# Patient Record
Sex: Male | Born: 1990
Health system: Southern US, Community
[De-identification: ages and names within clinical notes are randomized; demographics above are authoritative.]

## PROBLEM LIST (undated history)

## (undated) DIAGNOSIS — E7800 Pure hypercholesterolemia, unspecified: Secondary | ICD-10-CM

## (undated) DIAGNOSIS — E119 Type 2 diabetes mellitus without complications: Secondary | ICD-10-CM

## (undated) HISTORY — PX: VASECTOMY: SHX75

## (undated) HISTORY — PX: KNEE SURGERY: SHX244

---

## 2013-07-09 ENCOUNTER — Encounter: Payer: Self-pay | Admitting: Family Medicine

## 2013-07-09 ENCOUNTER — Ambulatory Visit (INDEPENDENT_AMBULATORY_CARE_PROVIDER_SITE_OTHER): Payer: BC Managed Care – PPO | Admitting: Family Medicine

## 2013-07-09 ENCOUNTER — Ambulatory Visit (INDEPENDENT_AMBULATORY_CARE_PROVIDER_SITE_OTHER): Payer: BC Managed Care – PPO

## 2013-07-09 VITALS — BP 143/88 | HR 75 | Temp 98.7°F | Ht 73.0 in | Wt 339.0 lb

## 2013-07-09 DIAGNOSIS — M25571 Pain in right ankle and joints of right foot: Secondary | ICD-10-CM

## 2013-07-09 DIAGNOSIS — M25579 Pain in unspecified ankle and joints of unspecified foot: Secondary | ICD-10-CM

## 2013-07-09 MED ORDER — MELOXICAM 15 MG PO TABS: 15.0000 mg | ORAL_TABLET | Freq: Every day | ORAL | Status: DC

## 2013-07-09 NOTE — Progress Notes (Signed)
  Subjective:    Patient ID: Adam Gomez, male    DOB: [birth year], 22 y.o.   MRN: [id number]  HPI Comments: Prior hx/o ankle sprains in the past assd with playing basketball Morbidly obese.    Ankle Pain  The incident occurred more than 1 week ago. The incident occurred at work. Injury mechanism: pt rolled ankle at work. The pain is present in the right ankle. The quality of the pain is described as aching. The pain is at a severity of 5/10. The pain is mild. The pain has been constant since onset. Associated symptoms include a loss of motion. Pertinent negatives include no loss of sensation, muscle weakness, numbness or tingling. He reports no foreign bodies present. The symptoms are aggravated by movement. He has tried nothing for the symptoms.      Review of Systems  Neurological: Negative for tingling and numbness.  All other systems reviewed and are negative.       Objective:   Physical Exam  Constitutional:  Morbidly obese   HENT:  Head: Normocephalic and atraumatic.  Eyes: Pupils are equal, round, and reactive to light.  Neck: Normal range of motion.  Cardiovascular: Normal rate and regular rhythm.   Pulmonary/Chest: Effort normal and breath sounds normal.  Abdominal: Soft.  Musculoskeletal:       Feet:  + TTP over affected  Mild swelling Full ROM    Neurological: He is alert.  Skin: Skin is warm.   [facility] reading (PRIMARY) by  Dr. Alvester Morin Preliminary R ankle xray with no fracture or dislocation.                                          Assessment & Plan:  Right ankle pain - Plan: DG Ankle Complete Right, meloxicam (MOBIC) 15 MG tablet  Xray preliminarily without fracture or dislocation.  Will brace  RICE and NSAIDs.  Discussed weight loss.  Discussed MSK red flags.  Follow up as needed.  Consider sports med referral if sxs persist.

## 2013-10-22 ENCOUNTER — Ambulatory Visit (INDEPENDENT_AMBULATORY_CARE_PROVIDER_SITE_OTHER): Payer: BC Managed Care – PPO | Admitting: Family Medicine

## 2013-10-22 DIAGNOSIS — H6690 Otitis media, unspecified, unspecified ear: Secondary | ICD-10-CM

## 2013-10-22 MED ORDER — AMOXICILLIN 875 MG PO TABS: 875.0000 mg | ORAL_TABLET | Freq: Two times a day (BID) | ORAL | Status: DC

## 2013-10-22 NOTE — Progress Notes (Signed)
   Subjective:    Patient ID: Dwana MelenaGarrett Calame, male    DOB: May 18, 1991, 23 y.o.   MRN: [id number]  HPI This 23 y.o. male presents for evaluation of bilateral ear discomfort an decreased hearing acuity.   Review of Systems No chest pain, SOB, HA, dizziness, vision change, N/V, diarrhea, constipation, dysuria, urinary urgency or frequency, myalgias, arthralgias or rash.     Objective:   Physical Exam Vital signs noted  Well developed well nourished male.  HEENT - Head atraumatic Normocephalic                Eyes - PERRLA, Conjuctiva - clear Sclera- Clear EOMI                Ears - EAC's Wnl TM's dull and injected bilateral                Nose - Nares boggy and decreased bilateral                Throat - oropharanx wnl Respiratory - Lungs CTA bilateral Cardiac - RRR S1 and S2 without murmur GI - Abdomen soft Nontender and bowel sounds active x 4 Extremities - No edema. Neuro - Grossly intact.       Assessment & Plan:  Otitis media, bilateral - Plan: amoxicillin (AMOXIL) 875 MG tablet Po bid x 10 days #20 Push po fluids, rest, tylenol and motrin otc prn as directed for fever, arthralgias, and myalgias.  Follow up prn if sx's continue or persist.  Deatra CanterWilliam J Tarhonda Hollenberg FNP

## 2013-10-22 NOTE — Patient Instructions (Signed)
Otitis Media with Effusion °Otitis media with effusion is the presence of fluid in the middle ear. This is a common problem that often follows ear infections. It may be present for weeks or longer after the infection. Unlike an acute ear infection, otits media with effusion refers only to fluid behind the ear drum and not infection. Children with repeated ear and sinus infections and allergy problems are the most likely to get otitis media with effusion. °CAUSES  °The most frequent cause of the fluid buildup is dysfunction of the eustacian tubes. These are the tubes that drain fluid in the ears to the throat. °SYMPTOMS  °· The main symptom of this condition is hearing loss. As a result, you or your child may: °· Listen to the TV at a loud volume. °· Not respond to questions. °· Ask "what" often when spoken to. °· There may be a sensation of fullness or pressure but usually not pain. °DIAGNOSIS  °· Your caregiver will diagnose this condition by examining you or your child's ears. °· Your caregiver may test the pressure in you or your child's ear with a tympanometer. °· A hearing test may be conducted if the problem persists. °· A caregiver will want to re-evaluate the condition periodically to see if it improves. °TREATMENT  °· Treatment depends on the duration and the effects of the effusion. °· Antibiotics, decongestants, nose drops, and cortisone-type drugs may not be helpful. °· Children with persistent ear effusions may have delayed language. Children at risk for developmental delays in hearing, learning, and speech may require referral to a specialist earlier than children not at risk. °· You or your child's caregiver may suggest a referral to an Ear, Nose, and Throat (ENT) surgeon for treatment. The following may help restore normal hearing: °· Drainage of fluid. °· Placement of ear tubes (tympanostomy tubes). °· Removal of adenoids (adenoidectomy). °HOME CARE INSTRUCTIONS  °· Avoid second hand  smoke. °· Infants who are breast fed are less likely to have this condition. °· Avoid feeding infants while laying flat. °· Avoid known environmental allergens. °· Be sure to see a caregiver or an ENT specialist for follow up. °· Avoid people who are sick. °SEEK MEDICAL CARE IF:  °· Hearing is not better in 3 months. °· Hearing is worse. °· Ear pain. °· Drainage from the ear. °· Dizziness. °Document Released: 11/14/2004 Document Revised: 12/30/2011 Document Reviewed: 05/04/2013 °[employer]® Patient Information ©2014 [employer]. ° °

## 2014-08-02 ENCOUNTER — Telehealth: Payer: Self-pay | Admitting: Family Medicine

## 2014-08-05 ENCOUNTER — Ambulatory Visit (INDEPENDENT_AMBULATORY_CARE_PROVIDER_SITE_OTHER): Payer: BC Managed Care – PPO | Admitting: Family Medicine

## 2014-08-05 ENCOUNTER — Encounter: Payer: Self-pay | Admitting: Family Medicine

## 2014-08-05 VITALS — BP 129/83 | HR 87 | Temp 98.6°F | Ht 73.0 in | Wt 327.2 lb

## 2014-08-05 DIAGNOSIS — J0100 Acute maxillary sinusitis, unspecified: Secondary | ICD-10-CM

## 2014-08-05 MED ORDER — METHYLPREDNISOLONE ACETATE 80 MG/ML IJ SUSP
80.0000 mg | Freq: Once | INTRAMUSCULAR | Status: AC
  Administered 2014-08-05: 80 mg via INTRAMUSCULAR

## 2014-08-05 MED ORDER — AMOXICILLIN 875 MG PO TABS: 875.0000 mg | ORAL_TABLET | Freq: Two times a day (BID) | ORAL | Status: DC

## 2014-08-05 NOTE — Progress Notes (Signed)
   Subjective:    Patient ID: Adam Gomez, male    DOB: January 18, 1991, 23 y.o.   MRN: [id number]  HPI This 23 y.o. male presents for evaluation of c/o uri sx's.   Review of Systems No chest pain, SOB, HA, dizziness, vision change, N/V, diarrhea, constipation, dysuria, urinary urgency or frequency, myalgias, arthralgias or rash.     Objective:   Physical Exam  Vital signs noted  Well developed well nourished male.  HEENT - Head atraumatic Normocephalic                Eyes - PERRLA, Conjuctiva - clear Sclera- Clear EOMI                Ears - EAC's Wnl TM's Wnl Gross Hearing WNL                Nose - Nares patent                 Throat - oropharanx wnl Respiratory - Lungs CTA bilateral Cardiac - RRR S1 and S2 without murmur GI - Abdomen soft Nontender and bowel sounds active x 4 Extremities - No edema. Neuro - Grossly intact.      Assessment & Plan:  Subacute maxillary sinusitis - Plan: amoxicillin (AMOXIL) 875 MG tablet, methylPREDNISolone acetate (DEPO-MEDROL) injection 80 mg

## 2014-08-09 NOTE — Telephone Encounter (Signed)
Patient was seen on 10/16 with Adam Gomez

## 2014-08-13 IMAGING — CR DG ANKLE COMPLETE 3+V*R*
3 series · 3 of 3 positions shown · non-contrast
Comparison: None.

CLINICAL DATA: Right ankle pain, no known injury

EXAM:
RIGHT ANKLE - COMPLETE 3+ VIEW

[view not recorded (1 of 3)]
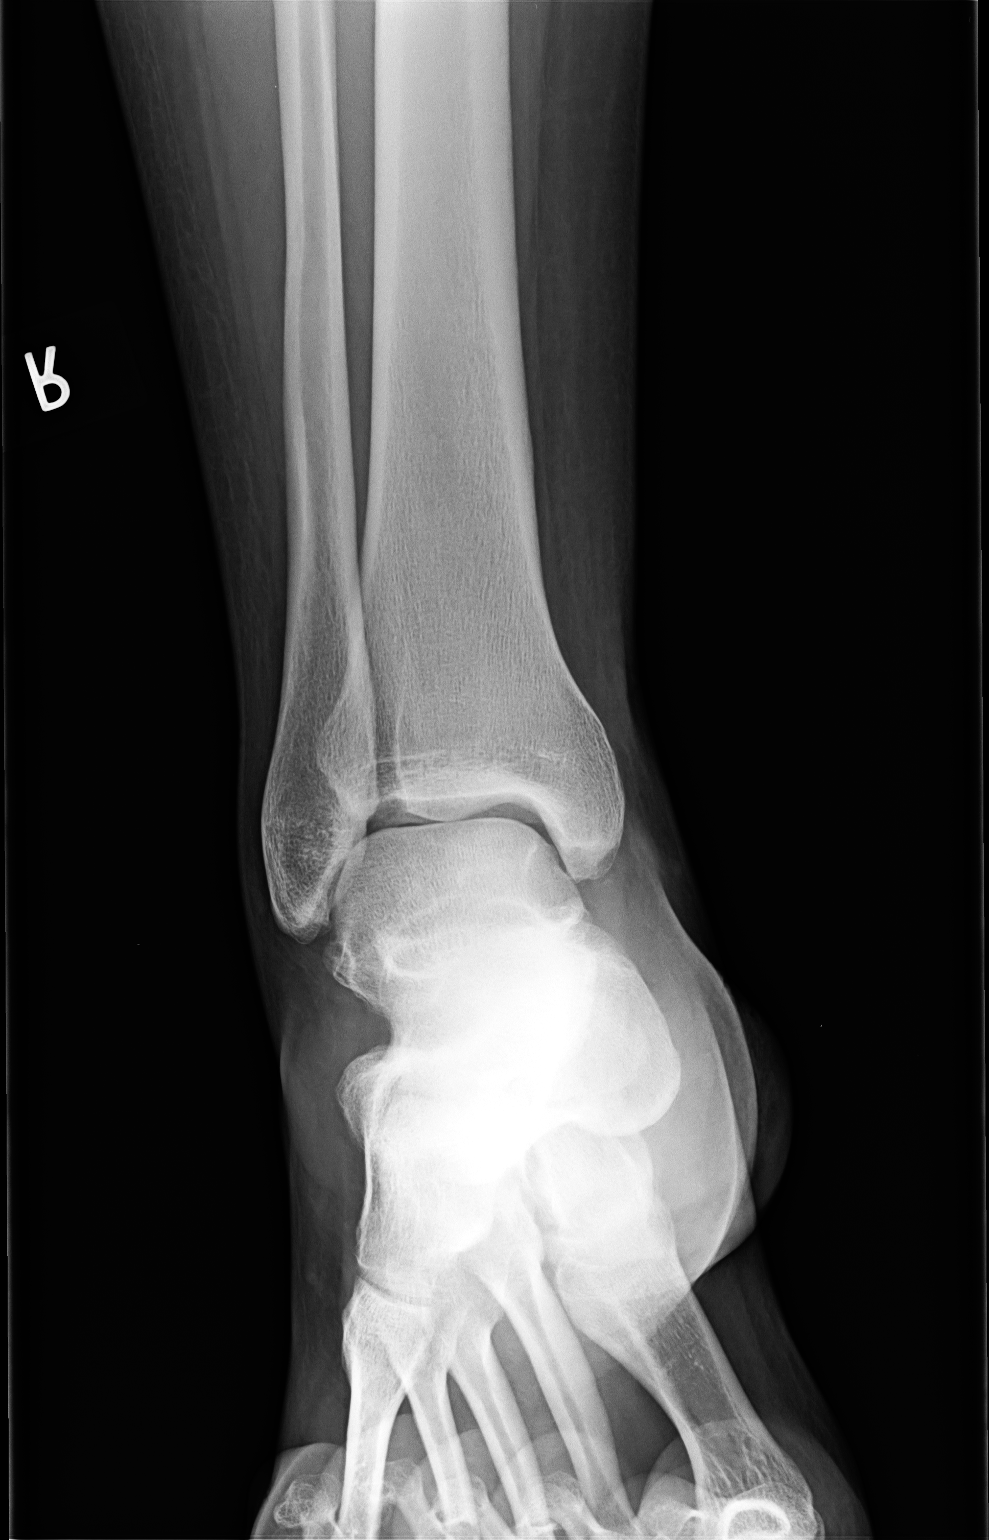

[view not recorded (2 of 3)]
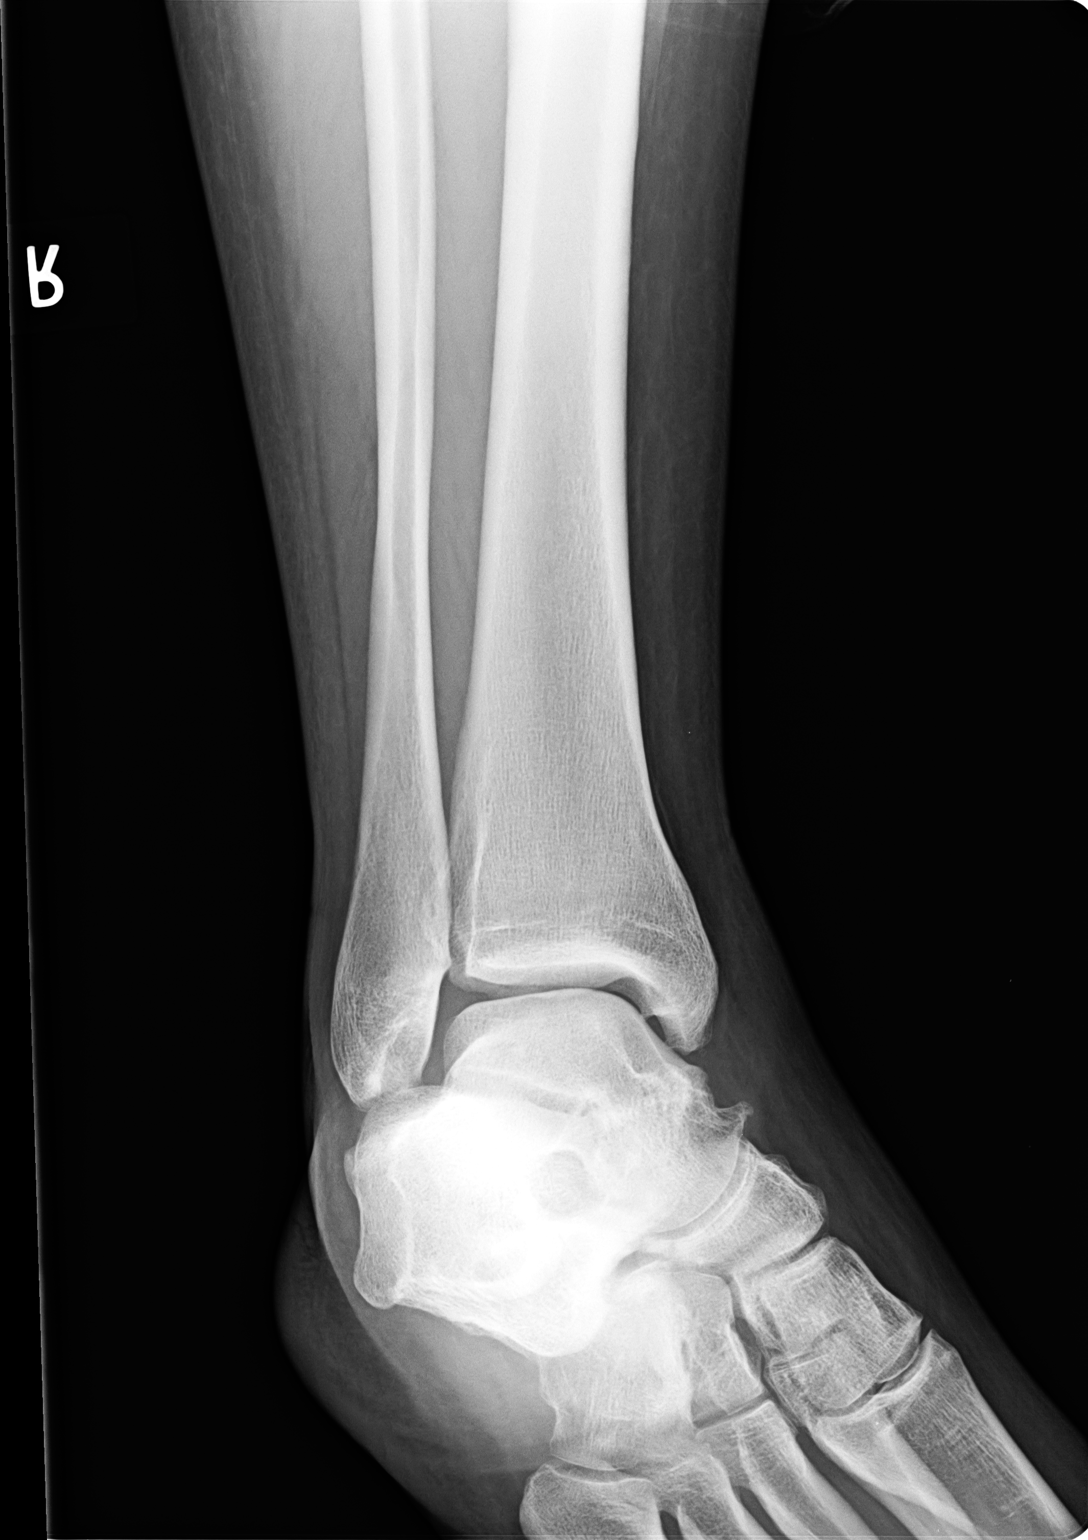

[view not recorded (3 of 3)]
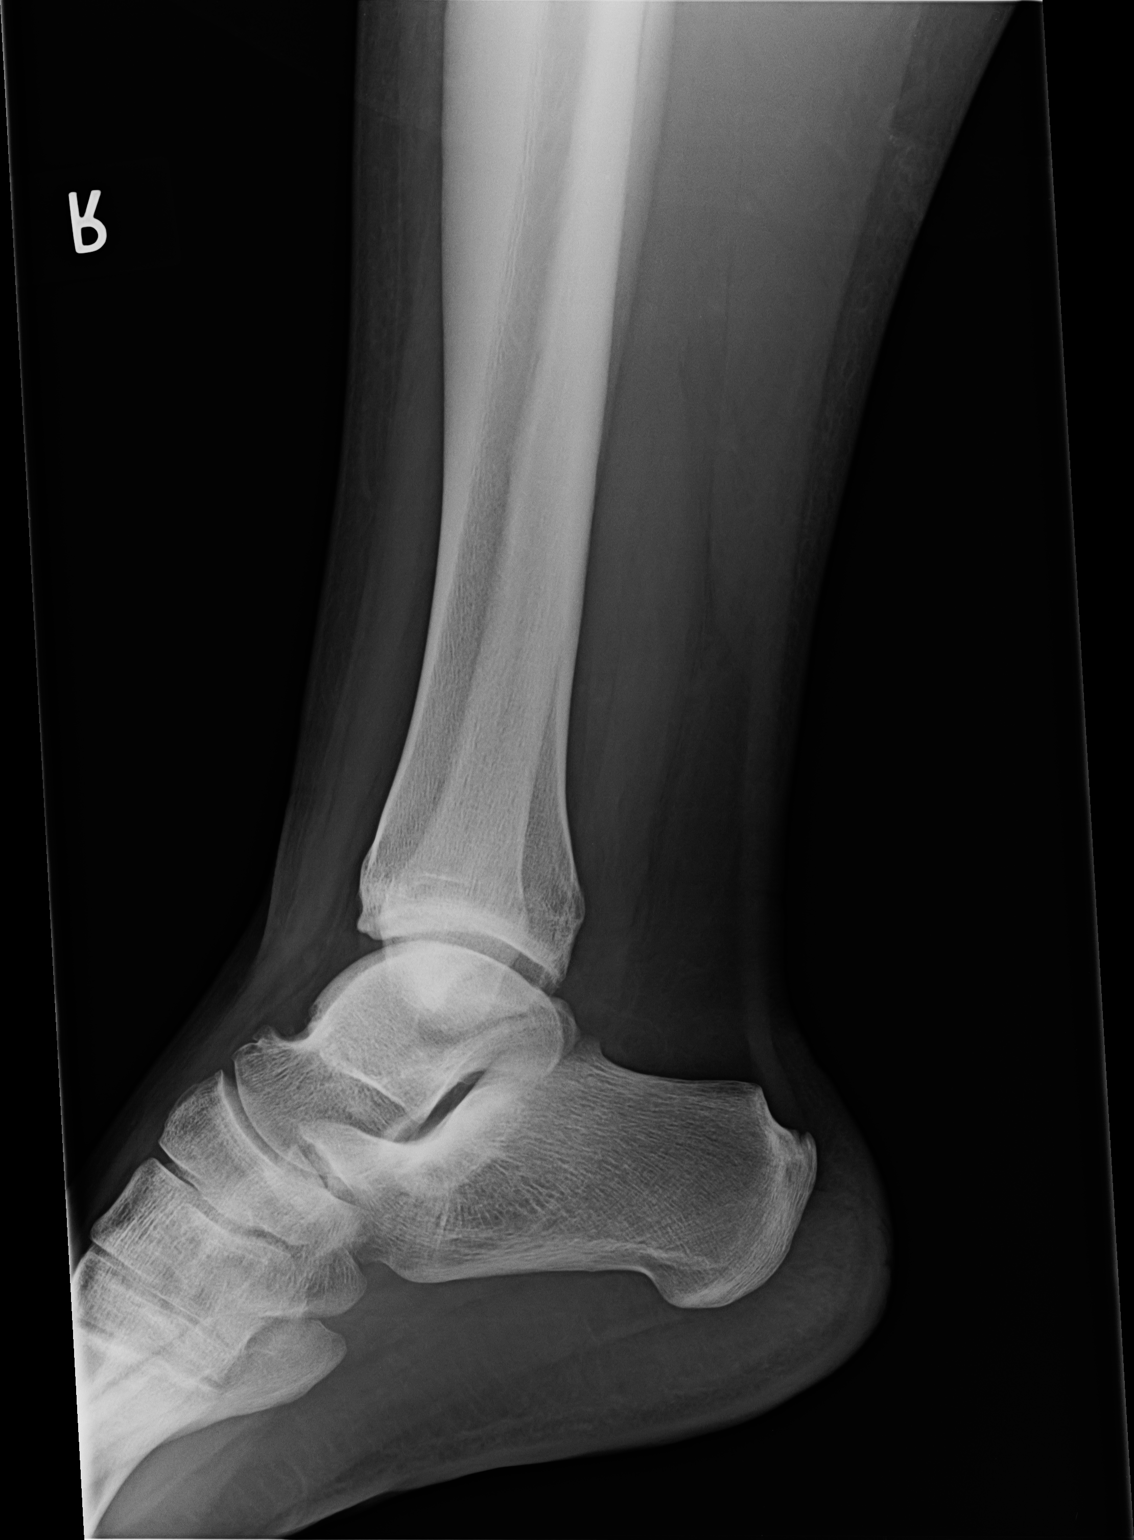

[3 of 3 positions shown; findings below may reference images not displayed]

FINDINGS: There is no evidence of fracture, dislocation, or joint effusion.
Mild spurring off the anterior superior aspect of the talus. Mild
spurring posterior aspect of calcaneus at Achilles tendon insertion.
Ankle mortise is preserved.
IMPRESSION: No acute fracture or subluxation. Mild spurring of talus and
posterior aspect of calcaneus.

## 2015-11-24 ENCOUNTER — Encounter: Payer: Self-pay | Admitting: Nurse Practitioner

## 2015-11-24 ENCOUNTER — Ambulatory Visit: Payer: Self-pay | Admitting: Nurse Practitioner

## 2015-11-24 ENCOUNTER — Ambulatory Visit (INDEPENDENT_AMBULATORY_CARE_PROVIDER_SITE_OTHER): Payer: BLUE CROSS/BLUE SHIELD | Admitting: Nurse Practitioner

## 2015-11-24 VITALS — BP 119/80 | HR 69 | Temp 97.1°F | Ht 73.0 in | Wt 353.0 lb

## 2015-11-24 DIAGNOSIS — J029 Acute pharyngitis, unspecified: Secondary | ICD-10-CM

## 2015-11-24 LAB — POCT RAPID STREP A (OFFICE): Rapid Strep A Screen: NEGATIVE

## 2015-11-24 NOTE — Progress Notes (Signed)
   Subjective:    Patient ID: Adam Gomez, male    DOB: October 13, 1991, 25 y.o.   MRN: [id number]  Sore Throat       Review of Systems     Objective:   Physical Exam        Assessment & Plan:   Subjective:     Adam Gomez is a 25 y.o. male who presents for evaluation of sore throat. Associated symptoms include nasal blockage, post nasal drip, sinus and nasal congestion and sore throat and headache. Onset of symptoms was 4 days ago, and have been gradually worsening since that time. He is drinking plenty of fluids. He has not had a recent close exposure to someone with proven streptococcal pharyngitis.  The following portions of the patient's history were reviewed and updated as appropriate: allergies, current medications, past family history, past medical history, past social history, past surgical history and problem list.  Review of Systems Pertinent items are noted in HPI.    Objective:    BP 119/80 mmHg  Pulse 69  Temp(Src) 97.1 F (36.2 C) (Oral)  Ht  (1.854 m)  Wt 353 lb (160.12 kg)  BMI 46.58 kg/m2 General appearance: alert and cooperative Ears: normal TM's and external ear canals both ears Nose: purulent discharge, moderate congestion, turbinates red Throat: lips, mucosa, and tongue normal; teeth and gums normal Neck: no adenopathy, no carotid bruit, no JVD, supple, symmetrical, trachea midline and thyroid not enlarged, symmetric, no tenderness/mass/nodules Back: symmetric, no curvature. ROM normal. No CVA tenderness. Heart: regular rate and rhythm, S1, S2 normal, no murmur, click, rub or gallop Lungs: CLear in all fields Laboratory Strep test done. Results: Results for orders placed or performed in visit on 11/24/15  POCT rapid strep A  Result Value Ref Range   Rapid Strep A Screen Negative Negative   .    Assessment:    Acute pharyngitis, likely  Viral pharyngitis.    Plan:   Force fluids Motrin or tylenol OTC OTC decongestant Throat  lozenges if help New toothbrush in 3 days  Mary-Margaret Daphine Deutscher, FNP

## 2016-03-13 ENCOUNTER — Ambulatory Visit (INDEPENDENT_AMBULATORY_CARE_PROVIDER_SITE_OTHER): Payer: BLUE CROSS/BLUE SHIELD | Admitting: Family Medicine

## 2016-03-13 ENCOUNTER — Encounter: Payer: Self-pay | Admitting: Family Medicine

## 2016-03-13 VITALS — BP 137/87 | HR 69 | Temp 98.4°F | Ht 73.0 in | Wt 350.6 lb

## 2016-03-13 DIAGNOSIS — W57XXXA Bitten or stung by nonvenomous insect and other nonvenomous arthropods, initial encounter: Secondary | ICD-10-CM | POA: Diagnosis not present

## 2016-03-13 DIAGNOSIS — T148 Other injury of unspecified body region: Secondary | ICD-10-CM

## 2016-03-13 MED ORDER — DOXYCYCLINE HYCLATE 100 MG PO TABS: 100.0000 mg | ORAL_TABLET | Freq: Two times a day (BID) | ORAL | Status: DC

## 2016-03-13 NOTE — Progress Notes (Signed)
BP 137/87 mmHg  Pulse 69  Temp(Src) 98.4 F (36.9 C) (Oral)  Ht  (1.854 m)  Wt 350 lb 9.6 oz (159.031 kg)  BMI 46.27 kg/m2   Subjective:    Patient ID: Adam Gomez, male    DOB: 02/21/1991, 25 y.o.   MRN: [id number]  HPI: Adam Gomez is a 25 y.o. male presenting on 03/13/2016 for Insect Bite   HPI Take by on fourth toe of left foot Patient found a very small tick on his fourth toe of his left foot and it was removed last night but today developed swelling that goes from the second two fourth toe and some erythema and significant pain. He has had no drainage or purulence. He is able to still move the toe completely and still has sensation in that toe as well. The worst of the swelling is overlying the second toe. He denies any fevers or chills or myalgias or rashes anywhere else.  Relevant past medical, surgical, family and social history reviewed and updated as indicated. Interim medical history since our last visit reviewed. Allergies and medications reviewed and updated.  Review of Systems  Constitutional: Negative for fever and chills.  HENT: Negative for ear discharge and ear pain.   Eyes: Negative for discharge and visual disturbance.  Respiratory: Negative for shortness of breath and wheezing.   Cardiovascular: Negative for chest pain and leg swelling.  Gastrointestinal: Negative for abdominal pain, diarrhea and constipation.  Genitourinary: Negative for difficulty urinating.  Musculoskeletal: Negative for back pain and gait problem.  Skin: Positive for color change and wound. Negative for rash.  Neurological: Negative for syncope, light-headedness and headaches.  All other systems reviewed and are negative.   Per HPI unless specifically indicated above     Medication List       This list is accurate as of: 03/13/16  9:40 AM.  Always use your most recent med list.               doxycycline 100 MG tablet  Commonly known as:  VIBRA-TABS  Take 1  tablet (100 mg total) by mouth 2 (two) times daily. 1 po bid     meloxicam 15 MG tablet  Commonly known as:  MOBIC  Take 15 mg by mouth as needed for pain.           Objective:    BP 137/87 mmHg  Pulse 69  Temp(Src) 98.4 F (36.9 C) (Oral)  Ht  (1.854 m)  Wt 350 lb 9.6 oz (159.031 kg)  BMI 46.27 kg/m2  Wt Readings from Last 3 Encounters:  03/13/16 350 lb 9.6 oz (159.031 kg)  11/24/15 353 lb (160.12 kg)  08/05/14 327 lb 3.2 oz (148.417 kg)    Physical Exam  Constitutional: He is oriented to person, place, and time. He appears well-developed and well-nourished. No distress.  Eyes: Conjunctivae and EOM are normal. Pupils are equal, round, and reactive to light. Right eye exhibits no discharge. No scleral icterus.  Cardiovascular: Normal rate, regular rhythm, normal heart sounds and intact distal pulses.   No murmur heard. Pulmonary/Chest: Effort normal and breath sounds normal. No respiratory distress. He has no wheezes.  Musculoskeletal: Normal range of motion. He exhibits no edema.  Neurological: He is alert and oriented to person, place, and time. Coordination normal.  Skin: Skin is warm and dry. No rash noted. He is not diaphoretic. There is erythema (Patient has an eschar over fourth toe of left foot and swelling and  erythema extending to the second toe. Sensation and range of motion and capillary refill are all intact).  Psychiatric: He has a normal mood and affect. His behavior is normal.  Vitals reviewed.     Assessment & Plan:   Problem List Items Addressed This Visit    None    Visit Diagnoses    Tick bite    -  Primary    Found take in fourth toe of left foot. Swelling and erythema surrounding that toe and the 2 next to it. Send doxycycline    Relevant Medications    doxycycline (VIBRA-TABS) 100 MG tablet        Follow up plan: Return if symptoms worsen or fail to improve.  Counseling provided for all of the vaccine components No orders of the  defined types were placed in this encounter.    Arville CareJoshua Tiwanna Tuch, MD [facility] 03/13/2016, 9:40 AM

## 2016-03-14 ENCOUNTER — Telehealth: Payer: Self-pay | Admitting: Family Medicine

## 2016-03-14 NOTE — Telephone Encounter (Signed)
That is fine go ahead and do the note.

## 2016-03-14 NOTE — Telephone Encounter (Signed)
Patient aware that letter is ready for pick up at the front desk.

## 2017-03-03 ENCOUNTER — Encounter: Payer: Self-pay | Admitting: *Deleted

## 2017-03-10 ENCOUNTER — Ambulatory Visit (INDEPENDENT_AMBULATORY_CARE_PROVIDER_SITE_OTHER): Payer: BLUE CROSS/BLUE SHIELD | Admitting: Pediatrics

## 2017-03-10 ENCOUNTER — Encounter: Payer: Self-pay | Admitting: Pediatrics

## 2017-03-10 VITALS — BP 134/91 | HR 90 | Temp 97.2°F | Resp 20 | Ht 73.0 in | Wt 357.6 lb

## 2017-03-10 DIAGNOSIS — J069 Acute upper respiratory infection, unspecified: Secondary | ICD-10-CM | POA: Diagnosis not present

## 2017-03-10 DIAGNOSIS — R0683 Snoring: Secondary | ICD-10-CM

## 2017-03-10 DIAGNOSIS — J329 Chronic sinusitis, unspecified: Secondary | ICD-10-CM | POA: Diagnosis not present

## 2017-03-10 MED ORDER — AMOXICILLIN 875 MG PO TABS: 875.0000 mg | ORAL_TABLET | Freq: Two times a day (BID) | ORAL | 0 refills | Status: AC

## 2017-03-10 NOTE — Progress Notes (Signed)
  Subjective:   Patient ID: Adam Gomez, male    DOB: 1991-05-31, 26 y.o.   MRN: [id number] CC: Headache; Nasal Congestion; Ear Pain; and Facial Pain  HPI: Adam Gomez is a 26 y.o. male presenting for Headache; Nasal Congestion; Ear Pain; and Facial Pain  5 days ago started having fatigue, congestion, HA Some drainage down back of throat Coughing a lot Having some cold chills Taking allegra, sudofed, tylenol, excedrin HA improved Congestion slightly improved today Ears draining a lot b/l, some pressure  Appetite down slightly, still eating regularly  Has been feeling tired and fatigued during day for over a year When he sits down sometimes will fall asleep Here today with his wife, she says he snores loudly at night, sometimes will have pauses Works 2nd shift, sometimes only getting 4-6hrs of sleep at night  Relevant past medical, surgical, family and social history reviewed. Allergies and medications reviewed and updated. History  Smoking Status  . Former Smoker  . Quit date: 07/09/2012  Smokeless Tobacco  . Former NeurosurgeonUser   ROS: Per HPI   Objective:    BP (!) 134/91   Pulse 90   Temp 97.2 F (36.2 C) (Oral)   Resp 20   Ht 6\' 1"  (1.854 m)   Wt (!) 357 lb 9.6 oz (162.2 kg)   SpO2 99%   BMI 47.18 kg/m   Wt Readings from Last 3 Encounters:  03/10/17 (!) 357 lb 9.6 oz (162.2 kg)  03/13/16 (!) 350 lb 9.6 oz (159 kg)  11/24/15 (!) 353 lb (160.1 kg)    Gen: NAD, alert, cooperative with exam, NCAT EYES: EOMI, no conjunctival injection, or no icterus ENT:  R TM red, some clear effusion present, L TM red, nl LR,  OP with mild erythema LYMPH: no cervical LAD CV: NRRR, normal S1/S2, no murmur, distal pulses 2+ b/l Resp: CTABL, no wheezes, normal WOB Abd: +BS, soft, NTND.  Ext: No edema, warm Neuro: Alert and oriented MSK: normal muscle bulk  Assessment & Plan:  Adam PebblesGarrett was seen today for headache, nasal congestion, ear pain and facial pain.  Diagnoses and all  orders for this visit:  Acute URI Discussed symptomatic care, return precautions If symptoms worsening over next few days, start below abx.  Sinusitis, unspecified chronicity, unspecified location -     amoxicillin (AMOXIL) 875 MG tablet; Take 1 tablet (875 mg total) by mouth 2 (two) times daily.  Snoring Pauses with breathing at night, loud snoring, not feeling rested Discussed sleep hygiene, will refer for sleep apnea eval Has physical in a couple weeks, will address BMI -     Ambulatory referral to Neurology  Follow up plan: For physical upcoming 2 weeks Rex Krasarol Vincent, MD [facility]

## 2017-03-10 NOTE — Patient Instructions (Addendum)
Netipot with distilled water 2-3 times a day to clear out sinuses Or Normal saline nasal spray Flonase steroid nasal spray Antihistamine daily such as cetirizine Ibuprofen 600mg  three times a day Lots of fluids  Start antibiotic if symptoms worsening over the next 3 days

## 2017-03-13 ENCOUNTER — Telehealth: Payer: Self-pay | Admitting: Pediatrics

## 2017-03-13 NOTE — Telephone Encounter (Signed)
That's fine, thanks!

## 2017-03-13 NOTE — Telephone Encounter (Signed)
Letter wrote and patient aware. 

## 2017-03-18 ENCOUNTER — Encounter: Payer: Self-pay | Admitting: Family Medicine

## 2017-03-18 ENCOUNTER — Ambulatory Visit (INDEPENDENT_AMBULATORY_CARE_PROVIDER_SITE_OTHER): Payer: BLUE CROSS/BLUE SHIELD | Admitting: Family Medicine

## 2017-03-18 VITALS — BP 126/83 | HR 69 | Temp 97.2°F | Ht 73.0 in | Wt 362.0 lb

## 2017-03-18 DIAGNOSIS — R030 Elevated blood-pressure reading, without diagnosis of hypertension: Secondary | ICD-10-CM

## 2017-03-18 DIAGNOSIS — J019 Acute sinusitis, unspecified: Secondary | ICD-10-CM

## 2017-03-18 MED ORDER — FLUTICASONE PROPIONATE 50 MCG/ACT NA SUSP: 1.0000 | Freq: Two times a day (BID) | NASAL | 6 refills | Status: DC | PRN

## 2017-03-18 MED ORDER — FLUTICASONE PROPIONATE 50 MCG/ACT NA SUSP
1.0000 | Freq: Two times a day (BID) | NASAL | 6 refills | Status: AC | PRN
Start: 2017-03-18 — End: ?

## 2017-03-18 NOTE — Assessment & Plan Note (Signed)
Have him recheck in 4 weeks

## 2017-03-18 NOTE — Progress Notes (Signed)
BP 126/83   Pulse 69   Temp 97.2 F (36.2 C) (Oral)   Ht 6\' 1"  (1.854 m)   Wt (!) 362 lb (164.2 kg)   BMI 47.76 kg/m    Subjective:    Patient ID: Adam Gomez, male    DOB: 09/15/1991, 26 y.o.   MRN: [id number]  HPI: Adam Gomez is a 26 y.o. male presenting on 03/18/2017 for Ears are stopped up (has recently had a lot of sinus congestion, pressure in ears; wears earplugs at work)   HPI Sinus congestion and pressure in ears Patient has been having sinus congestion and pressure in ears for the past 2 weeks. He came in about 10 days ago and was seen by her office and given amoxicillin which has improved most of his symptoms except the pressure in his ear. He says he still has a little pressure in his sinuses but mostly is having issues with ears. He feels like he gets a lot of popping especially when he yawns it easy years and is still muffled. He denies any fevers or chills any further. He denies any sick contacts that he knows of. He has started taking Allegra for allergy and has been using nasal saline which is helping some.  Elevated blood pressure today in the office. Recommended four-week follow-up. Patient denies any chest pain or shortness of breath.  Relevant past medical, surgical, family and social history reviewed and updated as indicated. Interim medical history since our last visit reviewed. Allergies and medications reviewed and updated.  Review of Systems  Constitutional: Negative for chills and fever.  HENT: Positive for congestion, ear pain, postnasal drip, rhinorrhea, sinus pressure, sneezing and sore throat. Negative for ear discharge and voice change.   Eyes: Negative for pain, discharge, redness and visual disturbance.  Respiratory: Positive for cough. Negative for shortness of breath and wheezing.   Cardiovascular: Negative for chest pain and leg swelling.  Musculoskeletal: Negative for gait problem.  Skin: Negative for rash.  All other systems reviewed  and are negative.   Per HPI unless specifically indicated above        Objective:    BP (!) 151/96   Pulse 69   Temp 97.2 F (36.2 C) (Oral)   Ht 6\' 1"  (1.854 m)   Wt (!) 362 lb (164.2 kg)   BMI 47.76 kg/m   Wt Readings from Last 3 Encounters:  03/18/17 (!) 362 lb (164.2 kg)  03/10/17 (!) 357 lb 9.6 oz (162.2 kg)  03/13/16 (!) 350 lb 9.6 oz (159 kg)    Physical Exam  Constitutional: He is oriented to person, place, and time. He appears well-developed and well-nourished. No distress.  HENT:  Right Ear: Tympanic membrane, external ear and ear canal normal.  Left Ear: Tympanic membrane, external ear and ear canal normal.  Nose: Mucosal edema and rhinorrhea present. No sinus tenderness. No epistaxis. Right sinus exhibits frontal sinus tenderness. Right sinus exhibits no maxillary sinus tenderness. Left sinus exhibits frontal sinus tenderness. Left sinus exhibits no maxillary sinus tenderness.  Mouth/Throat: Uvula is midline and mucous membranes are normal. Posterior oropharyngeal edema present. No oropharyngeal exudate, posterior oropharyngeal erythema or tonsillar abscesses.  Eyes: Conjunctivae are normal. No scleral icterus.  Neck: Neck supple. No thyromegaly present.  Cardiovascular: Normal rate, regular rhythm, normal heart sounds and intact distal pulses.   No murmur heard. Pulmonary/Chest: Effort normal and breath sounds normal. No respiratory distress. He has no wheezes. He has no rales.  Musculoskeletal: Normal range of  motion. He exhibits no edema.  Lymphadenopathy:    He has no cervical adenopathy.  Neurological: He is alert and oriented to person, place, and time. Coordination normal.  Skin: Skin is warm and dry. No rash noted. He is not diaphoretic.  Psychiatric: He has a normal mood and affect. His behavior is normal.  Nursing note and vitals reviewed.      Assessment & Plan:   Problem List Items Addressed This Visit      Other   Elevated blood pressure  reading    Have him recheck in 4 weeks       Other Visit Diagnoses    Acute rhinosinusitis    -  Primary   Just finished amoxicillin yesterday, recommend Flonase and Mucinex   Relevant Medications   fexofenadine (ALLEGRA) 30 MG tablet   fluticasone (FLONASE) 50 MCG/ACT nasal spray     His second check her blood pressure was good, will just have him come back and do a physical and get labs and make sure it stays good.   Follow up plan: Return if symptoms worsen or fail to improve.  Counseling provided for all of the vaccine components No orders of the defined types were placed in this encounter.   Arville CareJoshua Cecille Mcclusky, MD [facility] 03/18/2017, 10:22 AM

## 2017-04-22 ENCOUNTER — Encounter (INDEPENDENT_AMBULATORY_CARE_PROVIDER_SITE_OTHER): Payer: Self-pay

## 2017-04-22 ENCOUNTER — Encounter: Payer: Self-pay | Admitting: Neurology

## 2017-04-22 ENCOUNTER — Ambulatory Visit (INDEPENDENT_AMBULATORY_CARE_PROVIDER_SITE_OTHER): Payer: BLUE CROSS/BLUE SHIELD | Admitting: Neurology

## 2017-04-22 VITALS — BP 138/89 | HR 78 | Ht 74.0 in | Wt 361.0 lb

## 2017-04-22 DIAGNOSIS — G4726 Circadian rhythm sleep disorder, shift work type: Secondary | ICD-10-CM | POA: Insufficient documentation

## 2017-04-22 DIAGNOSIS — G4710 Hypersomnia, unspecified: Secondary | ICD-10-CM | POA: Insufficient documentation

## 2017-04-22 DIAGNOSIS — E669 Obesity, unspecified: Secondary | ICD-10-CM | POA: Insufficient documentation

## 2017-04-22 DIAGNOSIS — G4719 Other hypersomnia: Secondary | ICD-10-CM | POA: Diagnosis not present

## 2017-04-22 NOTE — Progress Notes (Signed)
SLEEP MEDICINE CLINIC   Provider:  Melvyn Novasarmen  Timothy Trudell, M D  Primary Care Physician:  Johna SheriffVincent, Carol L, MD   Referring Provider: Johna SheriffVincent, Carol L, MD   Chief Complaint  Patient presents with  . New Patient (Initial Visit)    HPI:  Adam Gomez is a 26 y.o. male , seen here as in a referral/ revisit  from Dr. Oswaldo DoneVincent for a sleep evaluation ; Chief complaint according to patient : " my wife reports I snore and I am sleepy all day ".  Adam Gomez is a right-handed, married Caucasian gentleman with 2 children presenting today for hypersomnia. He reports that he has rarely felt refreshed and restored even after a night of 8 hours of sleep. Sometimes he is better off sleeping not as long. He reports weight gain, fatigue, leg edema, loud snoring, thirst, lymph node swelling joint pain and joint swelling allergies, numbness and restless legs. He does not have any surgical history and no past medical history was endorsed.  Sleep habits are as follows: The patient works second shift, he is usually in bed between 2 AM and 3:30 AM. Some nights he will fall asleep on the couch or in an arm chair and his wife would not shim to go to bed. He has no trouble falling asleep. He does not wake up from sleep, sleeps through the night until about 7 or 8 AM. No nocturia. He only gets about 5 hours of nocturnal sleep.  Sleeps on the left or supine, where he snores loudly. He can snore even when prone. Multiple pillows. Restless sleeper. The couple describes her bedroom is cool, quiet and dark.  He does have some respiratory allergies to environmental factors and some nights wakes up with a dry mouth. He does not report headaches on a regular basis, palpitations, diaphoresis, nausea or dizziness in AM. During the day he will fall asleep inadvertently, unscheduled. 2-3 hours if not woken up. His wife is bothered by his snoring.   Sleep medical history and family sleep history: The patient is unaware of apnea between  his siblings or parents, his mother was a sleep walker, he was a sleep talker, now sleep laughing.  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 Married with 2 children.  Dip tobacco, non smoker. ETOH ; rarely. Caffeine: Soda and coffee- 3 a day.   Review of Systems: Out of a complete 14 system review, the patient complains of only the following symptoms, and all other reviewed systems are negative.  All Epworth score questions were endorsed at 3 points except for watching television at 2 points, sitting inactive in a public place at 2 points, in a car while stopped in traffic at one point. Epworth score 20/24  , Fatigue severity score 26  , depression score n/a    Social History   Social History  . Marital status: Married    Spouse name: N/A  . Number of children: N/A  . Years of education: N/A   Occupational History  . Not on file.   Social History Main Topics  . Smoking status: Former Smoker    Quit date: 07/09/2012  . Smokeless tobacco: Former NeurosurgeonUser  . Alcohol use No  . Drug use: No  . Sexual activity: Not on file   Other Topics Concern  . Not on file   Social History Narrative  . No narrative on file    No family history on file.  No past medical history on file.  No past surgical history on file.  Current Outpatient Prescriptions  Medication Sig Dispense Refill  . fexofenadine (ALLEGRA) 30 MG tablet Take 30 mg by mouth daily.    . fluticasone (FLONASE) 50 MCG/ACT nasal spray Place 1 spray into both nostrils 2 (two) times daily as needed for allergies or rhinitis. 16 g 6   No current facility-administered medications for this visit.     Allergies as of 04/22/2017  . (No Known Allergies)    Vitals: BP 138/89   Pulse 78   Ht 6\' 2"  (1.88 m)   Wt (!) 361 lb (163.7 kg)   BMI 46.35 kg/m  Last Weight:  Wt Readings from Last 1 Encounters:  04/22/17 (!) 361 lb (163.7 kg)   ZOX:WRUE mass index is 46.35 kg/m.     Last  Height:   Ht Readings from Last 1 Encounters:  04/22/17 6\' 2"  (1.88 m)    Physical exam:  General: The patient is awake, alert and appears not in acute distress. The patient is well groomed. Head: Normocephalic, atraumatic. Neck is supple. Mallampati 5,  neck circumference:18. 5 . Nasal airflow  Patent , Retrognathia is not seen.  Cardiovascular:  Regular rate and rhythm, without  murmurs or carotid bruit, and without distended neck veins. Respiratory: Lungs are clear to auscultation. Skin:  Without evidence of edema, or rash- tattooed  Trunk: BMI is 46. The patient's posture is erect   Neurologic exam : The patient is awake and alert, oriented to place and time.  Attention span & concentration ability appears normal.  Speech is fluent,  with dysphonia . Mood and affect are appropriate.  Cranial nerves: Pupils are equal and briskly reactive to light. Funduscopic exam without  evidence of pallor or edema. Extraocular movements  in vertical and horizontal planes intact and without nystagmus. Visual fields by finger perimetry are intact. Hearing to finger rub intact.   Facial sensation intact to fine touch.Facial motor strength is symmetric and tongue and uvula move midline. Shoulder shrug was symmetrical.   Motor exam:   Normal tone, muscle bulk and symmetric strength in all extremities. Sensory:  Fine touch, pinprick and vibration were tested in all extremities. Proprioception tested in the upper extremities was normal. Coordination: Rapid alternating movements in the fingers/hands was normal. Finger-to-nose maneuver  normal without evidence of ataxia, dysmetria or tremor.  Gait and station: Patient walks without assistive device and is able unassisted to climb up to the exam table. Strength within normal limits.  Stance is stable and normal.  Tandem gait is unfragmented. Turns with 3 Steps.  Deep tendon reflexes: in the  upper and lower extremities are symmetric and intact. Babinski  maneuver response is downgoing.    Assessment:  After physical and neurologic examination, review of laboratory studies,  Personal review of imaging studies, reports of other /same  Imaging studies, results of polysomnography and / or neurophysiology testing and pre-existing records as far as provided in visit., my assessment is   1) Adam Gomez displays signs and symptoms of obstructive sleep apnea including severe daytime hypersomnia, he also has the risk factors of a narrow upper airway, a large neck circumference, a BMI that exceeds 45, and remarkably he is not fatigued but selectively daytime sleepy. Part of the patient's hypersomnia could be related to his shift work schedule, he does not allocate more than 5 hours a day truly to sleep. He does not display a complete circadian rhythm disorder as he  works second shift but not third shift. He has worked first shift and felt better, more alert and rested.   2)the patients wife has witnessed him to snore loudly and has bothered her, she also has noted irregular breathing. I think it is due to Adam Gomez use that he has not developed nocturia, palpitations or diaphoresis. At this time when his life he still able to compensate. I would very much like to evaluate him in a split-night polysomnography I do not need August the to be included.   I would allow splitting at one hour if the baseline hour documented an AHI of 30 and higher .    The patient was advised of the nature of the diagnosed disorder , the treatment options and the  risks for general health and wellness arising from not treating the condition.   I spent more than 45 minutes of face to face time with the patient.  Greater than 50% of time was spent in counseling and coordination of care. We have discussed the diagnosis and differential and I answered the patient's questions.    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 Arrival at 2.00 AM - end at 12.00 noon.  Split at AHI 30 .     Melvyn Novas, MD 04/22/2017, 9:19 AM  Certified in Neurology by ABPN Certified in Sleep Medicine by [facility] [address], [address] [place], [place] [address]

## 2017-04-22 NOTE — Patient Instructions (Signed)

## 2017-05-02 ENCOUNTER — Encounter: Payer: BLUE CROSS/BLUE SHIELD | Admitting: Pediatrics

## 2017-05-05 ENCOUNTER — Encounter: Payer: BLUE CROSS/BLUE SHIELD | Admitting: Pediatrics

## 2017-05-12 ENCOUNTER — Encounter: Payer: BLUE CROSS/BLUE SHIELD | Admitting: Pediatrics

## 2017-06-13 ENCOUNTER — Ambulatory Visit (INDEPENDENT_AMBULATORY_CARE_PROVIDER_SITE_OTHER): Payer: BLUE CROSS/BLUE SHIELD | Admitting: Neurology

## 2017-06-13 DIAGNOSIS — G4719 Other hypersomnia: Secondary | ICD-10-CM

## 2017-06-13 DIAGNOSIS — G4726 Circadian rhythm sleep disorder, shift work type: Secondary | ICD-10-CM

## 2017-06-13 DIAGNOSIS — E669 Obesity, unspecified: Secondary | ICD-10-CM

## 2017-06-13 DIAGNOSIS — G4733 Obstructive sleep apnea (adult) (pediatric): Secondary | ICD-10-CM | POA: Diagnosis not present

## 2017-06-13 DIAGNOSIS — G4710 Hypersomnia, unspecified: Secondary | ICD-10-CM

## 2017-06-16 NOTE — Addendum Note (Signed)
Addended by: Melvyn Novas on: 06/16/2017 05:58 PM   Modules accepted: Orders

## 2017-06-16 NOTE — Procedures (Signed)
PATIENT'S NAME:  Adam Gomez, Adam Gomez DOB:      [birth year]      MR#:    [id number]     DATE OF RECORDING: 06/13/2017 REFERRING M.D.:  Okey Regal L. Oswaldo Done, MD Study Performed:   Baseline Polysomnogram HISTORY: Mr. Eslick is seen for hypersomnia. He reports that he has rarely felt refreshed and restored even after a night of 8 hours of sleep. Sometimes he is better off sleeping not as long. He reports weight gain to morbid obesity, fatigue, leg edema, loud snoring, thirst, lymph node swelling, joint pain and joint swelling allergies, numbness and restless legs. He does not have any surgical history and no past medical history was endorsed. The patient endorsed the Epworth Sleepiness Scale at 20/24 points.   The patient's weight 361 pounds with a height of 74 (inches), resulting in a BMI of 46.4 kg/m2. The patient's neck circumference measured 18.5 inches.  CURRENT MEDICATIONS: Allegra, Flonase   PROCEDURE:  This is a multichannel digital polysomnogram utilizing the SomnoStar 11.2 system.  Electrodes and sensors were applied and monitored per AASM Specifications.   EEG, EOG, Chin and Limb EMG, were sampled at 200 Hz.  ECG, Snore and Nasal Pressure, Thermal Airflow, Respiratory Effort, CPAP Flow and Pressure, Oximetry was sampled at 50 Hz. Digital video and audio were recorded.      BASELINE STUDY  : Lights Out was at 20:30 and Lights On at 05:01.  Total recording time (TRT) was 511 minutes, with a total sleep time (TST) of 469 minutes.  The patient's sleep latency was 4.5 minutes.  REM latency was 145.5 minutes.  The sleep efficiency was 91.8 %.     SLEEP ARCHITECTURE: WASO (Wake after sleep onset) was 37 minutes.  There were 18 minutes in Stage N1, 299 minutes Stage N2, 81.5 minutes Stage N3 and 70.5 minutes in Stage REM.  The percentage of Stage N1 was 3.8%, Stage N2 was 63.8%, Stage N3 was 17.4% and Stage R (REM sleep) was 15.%.   RESPIRATORY ANALYSIS:  There were a total of 93 respiratory events:  4  obstructive apneas, 0 central apneas and 2 mixed apneas with 87 hypopneas with a hypopnea index of 11.1 /hour. The patient also had 1 respiratory event related arousals (RERAs).    The total APNEA/HYPOPNEA INDEX (AHI) was 11.9/hour and the total RESPIRATORY DISTURBANCE INDEX was 12. /hour.  42 events occurred in REM sleep and 96 events in NREM. The REM AHI was 35.7 /hour, versus a non-REM AHI of 7.7. The patient spent 200.5 minutes of total sleep time in the supine position and 269 minutes in non-supine. The supine AHI was 14.1 versus a non-supine AHI of 10.2.  OXYGEN SATURATION & C02:  The Wake baseline 02 saturation was 96%, with the lowest being 77%. Time spent below 89% saturation equaled 25 minutes.   PERIODIC LIMB MOVEMENTS:   The patient had a total of 0 Periodic Limb Movements.  The arousals were noted as: 38 were spontaneous, 0 were associated with PLMs, and 33 were associated with respiratory events.  Audio and video analysis did not show any abnormal or unusual movements, behaviors, phonations or vocalizations. The patient took one bathroom break. Moderate Snoring was noted. EKG was in keeping with normal sinus rhythm (NSR).  Post-study, the patient indicated that sleep was the same as usual.    IMPRESSION:  1. Mild Obstructive Sleep Apnea (OSA) at AHI of 11.9/hr. 2. High sleep efficiency.  RECOMMENDATIONS:  1. Advise full-night, attended, CPAP titration study to  optimize therapy. The patient's high degree of daytime sleepiness will be re checked after 30-60 days on CPAP therapy.  2. Avoid sedative-hypnotics which may worsen sleep apnea, alcohol and tobacco (as applicable). 3. Advise to lose weight, diet and exercise if not contraindicated (BMI 47). 4. Advise patient to avoid driving or operating hazardous machinery when sleepy. Further information regarding OSA may be obtained from [employer] (www.sleepfoundation.org) or American Sleep Apnea Association  (www.sleepapnea.org). 5. A follow up appointment will be scheduled in the Sleep Clinic at [facility]. The referring provider will be notified of the results.      I certify that I have reviewed the entire raw data recording prior to the issuance of this report in accordance with the Standards of Accreditation of the American Academy of Sleep Medicine (AASM)   Melvyn Novas, MD      06-16-2017 Diplomat, American Board of Psychiatry and Neurology  Diplomat, American Board of Sleep Medicine Medical Director, [place] [facility]

## 2017-06-19 ENCOUNTER — Telehealth: Payer: Self-pay | Admitting: Neurology

## 2017-06-19 NOTE — Telephone Encounter (Signed)
-----   Message from Melvyn Novasarmen Dohmeier, MD sent at 06/16/2017  5:58 PM EDT ----- Mr. Adam Gomez was seen for excessively daytime sleepiness- Epworth of 20/24 points. The only abnormality was mild sleep apnea, some snoring. There was no insomnia, the patient slept uninterrupted and for over 460 minutes.    IMPRESSION:  1. Mild Obstructive Sleep Apnea (OSA) at AHI of 11.9/hr. 2. High sleep efficiency.  RECOMMENDATIONS:  1. Since the only abnormality found was mild apnea , I advise full-night, attended, CPAP titration study to optimize  therapy. The patient's high degree of daytime sleepiness will be  re checked after 30-60 days on CPAP therapy.  2. Avoid sedative-hypnotics which may worsen sleep apnea, alcohol  and tobacco (as applicable). 3. Advise to lose weight, diet and exercise if not  contraindicated (BMI 47). 4. Advise patient to avoid driving or operating hazardous  machinery when sleepy.

## 2017-06-19 NOTE — Telephone Encounter (Signed)
I called pt. I advised pt that Dr. Vickey Hugerohmeier reviewed their sleep study results and found that pt has sleep apnea. Dr. Vickey Hugerohmeier recommends that pt return to the lab for a titration study.  Pt verbalized understanding of results. Pt had no questions at this time but was encouraged to call back if questions arise.

## 2017-06-19 NOTE — Telephone Encounter (Signed)
Pt called back he is wanting to know how long was average time he stopped breathing each time. Please call

## 2017-06-19 NOTE — Telephone Encounter (Signed)
Called to answer the patients questions. No answer at this time. LVM for the patient explaining that in the apt Dr Dohmeier will go over all this information.

## 2017-07-20 ENCOUNTER — Ambulatory Visit (INDEPENDENT_AMBULATORY_CARE_PROVIDER_SITE_OTHER): Payer: BLUE CROSS/BLUE SHIELD | Admitting: Neurology

## 2017-07-20 DIAGNOSIS — G4710 Hypersomnia, unspecified: Secondary | ICD-10-CM

## 2017-07-20 DIAGNOSIS — E669 Obesity, unspecified: Secondary | ICD-10-CM

## 2017-07-20 DIAGNOSIS — G4733 Obstructive sleep apnea (adult) (pediatric): Secondary | ICD-10-CM | POA: Diagnosis not present

## 2017-07-20 DIAGNOSIS — G4719 Other hypersomnia: Secondary | ICD-10-CM

## 2017-07-20 DIAGNOSIS — G4726 Circadian rhythm sleep disorder, shift work type: Secondary | ICD-10-CM

## 2017-07-22 NOTE — Addendum Note (Signed)
Addended by: Melvyn Novas on: 07/22/2017 08:05 AM   Modules accepted: Orders

## 2017-07-22 NOTE — Procedures (Signed)
PATIENT'S NAME:  Adam Gomez, Adam Gomez DOB:      1991/05/09      MR#:    [id number]     DATE OF RECORDING: 07/20/2017 REFERRING M.D.:  Okey Regal L. Oswaldo Done, MD Study Performed:   CPAP Titration HISTORY:     Excessive daytime sleepiness, Super obesity, Sleep disorder, shift-work  Mr. Kallam reports that he has rarely felt refreshed and restored even after a night of 8 hours of sleep. Sometimes he is better off sleeping not as long. He reports weight gain to morbid obesity, fatigue, leg edema, loud snoring, thirst, lymph node swelling, joint pain and joint swelling allergies, numbness and restless legs. The patient endorsed the Epworth Sleepiness Scale at 20/24 points.   The patient's weight 361 pounds with a height of 74 (inches), resulting in a BMI of 46.4 kg/m2. The patient's neck circumference measured 18.5 inches.   The patient endorsed the Epworth Sleepiness Scale at 20 points.   The patient's weight 361 pounds with a height of 74 (inches), resulting in a BMI of 46.4 kg/m2. The patient's neck circumference measured 18.5 inches.  CURRENT MEDICATIONS: Allegra, Flonase    PROCEDURE:  This is a multichannel digital polysomnogram utilizing the SomnoStar 11.2 system.  Electrodes and sensors were applied and monitored per AASM Specifications.   EEG, EOG, Chin and Limb EMG, were sampled at 200 Hz.  ECG, Snore and Nasal Pressure, Thermal Airflow, Respiratory Effort, CPAP Flow and Pressure, Oximetry was sampled at 50 Hz. Digital video and audio were recorded.      CPAP was initiated at 6 cmH20 with heated humidity per AASM split night standards and pressure was advanced to 12 cmH20 because of hypopneas, apneas and desaturations.  At a PAP pressure of 12 cmH20, there was a reduction of the AHI to 0.0 with improvement of the SpO2 nadir to 92% and total sleep time of 253.5 minutes.   Lights Out was at 23:08 and Lights On at 05:10. Total recording time (TRT) was 362.5 minutes, with a total sleep time (TST) of 354  minutes. The patient's sleep latency was 4.5 minutes with 0 minutes of wake time after sleep onset. REM latency was 71.5 minutes.  The sleep efficiency was 97.7 %.    SLEEP ARCHITECTURE: WASO (Wake after sleep onset) was 4 minutes.  There were 10 minutes in Stage N1, 152.5 minutes Stage N2, 96 minutes Stage N3 and 95.5 minutes in Stage REM.  The percentage of Stage N1 was 2.8%, Stage N2 was 43.1%, Stage N3 was 27.1% and Stage R (REM sleep) was 27.0%.  The arousals were noted as: 15 were spontaneous, 0 were associated with PLMs, and 5 were associated with respiratory events.  RESPIRATORY ANALYSIS:  There was a total of 16 respiratory events: 5 obstructive apneas, 0 central apneas and 0 mixed apneas with a total of 5 apneas and an apnea index (AI) of 0.8 /hour. There were 11 hypopneas with a hypopnea index of 1.9/hour. The patient also had 0 respiratory event related arousals (RERAs).      The total APNEA/HYPOPNEA INDEX  (AHI) was 2.7 /hour and the total RESPIRATORY DISTURBANCE INDEX was 2.7/ hour  9 events occurred in REM sleep and 7 events in NREM. The REM AHI was 5.7 /hour versus a non-REM AHI of 1.6 /hour.  The patient spent 156.5 minutes of total sleep time in the supine position and 198 minutes in non-supine. The supine AHI was 6.1, versus a non-supine AHI of 0.0.  OXYGEN SATURATION & C02:  The  baseline 02 saturation was 0%, with the lowest being 89%. Time spent below 89% saturation equaled 0 minutes.  PERIODIC LIMB MOVEMENTS:    The patient had a total of 0 Periodic Limb Movements. Audio and video analysis did not show any abnormal or unusual movements, behaviors, phonations or vocalizations.   The patient took bathroom breaks. Snoring was noted. EKG was in keeping with normal sinus rhythm (NSR). Post-study, the patient indicated that sleep was the same as usual.   DIAGNOSIS Obstructive Sleep Apnea responding to CPAP at 12 cm water with nasal pillows. The patient was fitted with a ResMed P10  nasal pillows in medium size.  PLANS/RECOMMENDATIONS: aside form CPAP therapy: a. Weight loss if appropriate. b. Avoidance of medications with muscle relaxant properties. c. Avoidance of ingestion of alcohol prior to sleeping. d. Avoiding sleeping in the supine position (on one's back). e. Improvement of nasal patency if indicated. f. Avoidance of driving if or when sleepy.  DISCUSSION: An order for a CPAP machine (auto- titration capable) to be set for therapy at 12 cm water pressure with heated humidity, and for ResMed P10 nasal pillows in medium size was issued.   A follow up appointment will be scheduled in the Sleep Clinic at [facility].   Please call [phone] with any questions.     I certify that I have reviewed the entire raw data recording prior to the issuance of this report in accordance with the Standards of Accreditation of the American Academy of Sleep Medicine (AASM)    Melvyn Novas, M.D.      07-22-2017 Diplomat, American Board of Psychiatry and Neurology  Diplomat, American Board of Sleep Medicine Medical Director of [facility]

## 2017-07-24 ENCOUNTER — Telehealth: Payer: Self-pay | Admitting: Neurology

## 2017-07-24 ENCOUNTER — Other Ambulatory Visit: Payer: Self-pay | Admitting: Neurology

## 2017-07-24 NOTE — Telephone Encounter (Signed)
I called pt. I advised pt that Dr. Vickey Huger reviewed their sleep study results and found that pt had OSA. Dr. Vickey Huger recommends that pt starts CPAP. I reviewed PAP compliance expectations with the pt. Pt is agreeable to starting a CPAP. I advised pt that an order will be sent to a DME, [facility], and [facility] will call the pt within about one week after they file with the pt's insurance. [facility] will show the pt how to use the machine, fit for masks, and troubleshoot the CPAP if needed. A follow up appt was made for insurance purposes with Dr. Vickey Huger on Oct 15, 2017 at 10:30 am. Pt verbalized understanding to arrive 15 minutes early and bring their CPAP. A letter with all of this information in it will be mailed to the pt as a reminder. I verified with the pt that the address we have on file is correct. Pt verbalized understanding of results. Pt had no questions at this time but was encouraged to call back if questions arise.

## 2017-07-24 NOTE — Telephone Encounter (Signed)
-----   Message from Melvyn Novas, MD sent at 07/22/2017  8:05 AM EDT ----- PLANS/RECOMMENDATIONS: aside from CPAP therapy: a. Weight loss- main risk factor here needs to addressed. b. Avoidance of medications with muscle relaxant properties, if applicable . c. Avoidance of ingestion of alcohol prior to sleeping. d. Avoiding sleeping in the supine position (on one's back). e. Improvement of nasal patency if indicated. f. Avoidance of driving if or when sleepy.  DISCUSSION: An order for a CPAP machine (auto- titration capable)  to be set for therapy at 12 cm water pressure with heated  humidity, and for ResMed P10 nasal pillows in medium size was  issued.

## 2017-08-11 DIAGNOSIS — G4733 Obstructive sleep apnea (adult) (pediatric): Secondary | ICD-10-CM | POA: Diagnosis not present

## 2017-09-11 DIAGNOSIS — G4733 Obstructive sleep apnea (adult) (pediatric): Secondary | ICD-10-CM | POA: Diagnosis not present

## 2017-10-11 DIAGNOSIS — G4733 Obstructive sleep apnea (adult) (pediatric): Secondary | ICD-10-CM | POA: Diagnosis not present

## 2017-10-15 ENCOUNTER — Ambulatory Visit: Payer: Self-pay | Admitting: Neurology

## 2017-10-22 ENCOUNTER — Encounter: Payer: Self-pay | Admitting: Neurology

## 2017-10-26 ENCOUNTER — Encounter: Payer: Self-pay | Admitting: Neurology

## 2017-10-29 ENCOUNTER — Ambulatory Visit (INDEPENDENT_AMBULATORY_CARE_PROVIDER_SITE_OTHER): Payer: BLUE CROSS/BLUE SHIELD | Admitting: Neurology

## 2017-10-29 ENCOUNTER — Encounter: Payer: Self-pay | Admitting: Neurology

## 2017-10-29 VITALS — BP 130/85 | HR 78 | Ht 74.0 in | Wt 372.0 lb

## 2017-10-29 DIAGNOSIS — Z9989 Dependence on other enabling machines and devices: Secondary | ICD-10-CM | POA: Diagnosis not present

## 2017-10-29 DIAGNOSIS — Z6841 Body Mass Index (BMI) 40.0 and over, adult: Secondary | ICD-10-CM | POA: Diagnosis not present

## 2017-10-29 DIAGNOSIS — G4733 Obstructive sleep apnea (adult) (pediatric): Secondary | ICD-10-CM | POA: Diagnosis not present

## 2017-10-29 DIAGNOSIS — J40 Bronchitis, not specified as acute or chronic: Secondary | ICD-10-CM | POA: Insufficient documentation

## 2017-10-29 DIAGNOSIS — J322 Chronic ethmoidal sinusitis: Secondary | ICD-10-CM | POA: Insufficient documentation

## 2017-10-29 MED ORDER — AMOXICILLIN 500 MG PO CAPS: 500.0000 mg | ORAL_CAPSULE | Freq: Three times a day (TID) | ORAL | 0 refills | Status: DC

## 2017-10-29 NOTE — Progress Notes (Signed)
SLEEP MEDICINE CLINIC   Provider:  Melvyn Novas, M D  Primary Care Physician:  Johna Sheriff, MD   Referring Provider: Johna Sheriff, MD   Chief Complaint  Patient presents with  . Follow-up    pt with wife, he has been sick the last couple days, sinus infection and that has hindered  him from using it.    Mr. Adam Gomez is here today on 29 October 2017 following his 2 sleep studies last year.  His baseline polysomnogram took place on 13 June 2017 following his chief complaint of an Epworth sleepiness score of 20 out of 24 points indicating very high.  The patient was diagnosed with rather mild apnea at an AHI of 11.9 during REM sleep his AHI rose to 35.7 making CPAP the most applicable therapy.  He did not have periodic limb movements there were some arousals from sleep that were spontaneous but 33 arousals were associated with respiratory events.  He returned for a CPAP titration on 20 July 2017 was titrated to 12 cm water pressure his oxygen nadir improved to 92% and he slept 253.5 minutes with 0 apneas and at this pressure.  His apnea was also supine dependent.  The patient has been using his CPAP the last 23 out of 30 days but he developed a sinus infection and over the last for 5 days has not been able to use it.  Average use of time is 4 hours and 43 minutes set pressure is 12 cmH2O with 1 cm EPR he does have significant air leakage but his apneas very well controlled at residual apnea index of 0.5.  He has tried 3 different interfaces, started on nasal pillows- these dislodged and irritated the nostile.  [facility] showed him a lot of different options. He is now a full facemask but he does have facial hair which makes the air seal more difficult.  His Epworth score is reduced to 15 from 20 points. He has not to go to the bathroom at night .    HPI:  Adam Gomez is a 27 y.o. male , seen here as in a referral/ revisit  from Dr. Oswaldo Done for a sleep evaluation ; Chief  complaint according to patient : " my wife reports I snore and I am sleepy all day ". Mr. Adam Gomez is a right-handed, married Caucasian gentleman with 2 children presenting today for hypersomnia. He reports that he has rarely felt refreshed and restored even after a night of 8 hours of sleep. Sometimes he is better off sleeping not as long. He reports weight gain, fatigue, leg edema, loud snoring, thirst, lymph node swelling joint pain and joint swelling allergies, numbness and restless legs. He does not have any surgical history and no past medical history was endorsed. Sleep habits are as follows: The patient works second shift, he is usually in bed between 2 AM and 3:30 AM. Some nights he will fall asleep on the couch or in an arm chair and his wife would not shim to go to bed. He has no trouble falling asleep. He does not wake up from sleep, sleeps through the night until about 7 or 8 AM. No nocturia. He only gets about 5 hours of nocturnal sleep.  Sleeps on the left or supine, where he snores loudly. He can snore even when prone. Multiple pillows. Restless sleeper. The couple describes her bedroom is cool, quiet and dark. He does have some respiratory allergies to environmental factors and some nights wakes  up with a dry mouth. He does not report headaches on a regular basis, palpitations, diaphoresis, nausea or dizziness in AM. During the day he will fall asleep inadvertently, unscheduled. 2-3 hours if not woken up. His wife is bothered by his snoring.  Sleep medical history and family sleep history: The patient is unaware of apnea between his siblings or parents, his mother was a sleep walker, he was a sleep talker, now sleep laughing. Social history: shift worker -  Raised in [place] in a double wide, mom and step dad. Married with 2 children.  Dip tobacco, non smoker. ETOH ; rarely. Caffeine: Soda and coffee- 3 a day.   Review of Systems: Out of a complete 14 system review, the patient complains of only  the following symptoms, and all other reviewed systems are negative.  All Epworth score questions were endorsed at 3 points except for watching television at 2 points, sitting inactive in a public place at 2 points, in a car while stopped in traffic at one point. Epworth score  Now 15 from 20/24 - since on CPAP  , Fatigue severity score 21 from 26  , depression score n/a . Sinuistis, nasal speech, nasal pressure marks.   Social History   Socioeconomic History  . Marital status: Married    Spouse name: Not on file  . Number of children: Not on file  . Years of education: Not on file  . Highest education level: Not on file  Social Needs  . Financial resource strain: Not on file  . Food insecurity - worry: Not on file  . Food insecurity - inability: Not on file  . Transportation needs - medical: Not on file  . Transportation needs - non-medical: Not on file  Occupational History  . Not on file  Tobacco Use  . Smoking status: Former Smoker    Last attempt to quit: 07/09/2012    Years since quitting: 5.3  . Smokeless tobacco: Former Engineer, water and Sexual Activity  . Alcohol use: No  . Drug use: No  . Sexual activity: Not on file  Other Topics Concern  . Not on file  Social History Narrative  . Not on file    No family history on file.  No past medical history on file.  No past surgical history on file.  Current Outpatient Medications  Medication Sig Dispense Refill  . fexofenadine (ALLEGRA) 30 MG tablet Take 30 mg by mouth daily.    . fluticasone (FLONASE) 50 MCG/ACT nasal spray Place 1 spray into both nostrils 2 (two) times daily as needed for allergies or rhinitis. 16 g 6   No current facility-administered medications for this visit.     Allergies as of 10/29/2017  . (No Known Allergies)    Vitals: BP 130/85   Pulse 78   Ht 6\' 2"  (1.88 m)   Wt (!) 372 lb (168.7 kg)   BMI 47.76 kg/m  Last Weight:  Wt Readings from Last 1 Encounters:  10/29/17 (!) 372 lb  (168.7 kg)   NFA:OZHY mass index is 47.76 kg/m.     Last Height:   Ht Readings from Last 1 Encounters:  10/29/17 6\' 2"  (1.88 m)    Physical exam:  General: The patient is awake, alert and appears not in acute distress. The patient is well groomed. Head: Normocephalic, atraumatic. Neck is supple. Mallampati 5,  neck circumference:18. 5 . Nasal airflow  Patent , Retrognathia is not seen.  Cardiovascular:  Regular rate and rhythm,  Respiratory: Lungs are clear to auscultation. Skin:  Without evidence of edema, or rash- tattooed .  Trunk: BMI is still 46.   Neurologic exam : The patient is awake and alert, oriented to place and time.  Attention span & concentration ability appears normal.  Speech is fluent,  with dysphonia . Mood and affect are appropriate.  Cranial nerves: Pupils are equal and briskly reactive to light.. Visual fields by finger perimetry are intact. Hearing to finger rub intact.  Facial sensation intact to fine touch.Facial motor strength is symmetric and tongue and uvula move midline. Shoulder shrug was symmetrical.  Motor exam:   Normal tone, muscle bulk and symmetric strength in all extremities. Sensory:  Fine touch, pinprick and vibration were tested in all extremities. Proprioception tested in the upper extremities was normal. Gait and station: Patient walks without assistive device . Turns with 3 Steps. Deep tendon reflexes: in the  upper and lower extremities are symmetric .  Assessment:  After physical and neurologic examination, review of laboratory studies,  Personal review of imaging studies, reports of other /same  Imaging studies, results of polysomnography and / or neurophysiology testing and pre-existing records as far as provided in visit., my assessment is   1) Mr. Jenean LindauRhodes was diagnosed with obstructive sleep apnea , AHI 11, and REM AHI 34- strated CPAP, but developed sinusitis.  Symptoms have improved ;severe daytime hypersomnia, still high but better 15/  24 points.   he also has the risk factors of a narrow upper airway, a large neck circumference, a BMI that exceeds 45, and remarkably he is not fatigued but selectively daytime sleepy. Part of the patient's hypersomnia could be related to his shift work schedule, he does not allocate more than 5 hours a day truly to sleep.  The patient was advised of the nature of the diagnosed disorder , the treatment options and the  risks for general health and wellness arising from not treating the condition.   I spent more than 25 minutes of face to face time with the patient.  We discussed dietary changes- reduction in [place]- drink water.  Upkeep for CPAP discussed, vinegar and soapy water cleaning.  Humidifier to be adjusted - he has a very dry mouth in AM.  May consider shaving to achive better seal.    Greater than 50% of time was spent in counseling and coordination of care. We have discussed the diagnosis and differential and I answered the patient's questions.    Plan:  Treatment plan and additional workup : Rv in 12 month , needs supplies.    Melvyn NovasARMEN Kali Ambler, MD 10/29/2017, 9:54 AM  Certified in Neurology by ABPN Certified in Sleep Medicine by [facility]  [facility] [address], [address] [place], [place] [id number]

## 2017-10-29 NOTE — Addendum Note (Signed)
Addended by: Melvyn NovasHMEIER, Davelle Anselmi on: 10/29/2017 10:24 AM   Modules accepted: Orders

## 2017-11-11 DIAGNOSIS — G4733 Obstructive sleep apnea (adult) (pediatric): Secondary | ICD-10-CM | POA: Diagnosis not present

## 2017-12-01 ENCOUNTER — Telehealth: Payer: Self-pay | Admitting: Pediatrics

## 2017-12-01 ENCOUNTER — Encounter: Payer: Self-pay | Admitting: Family Medicine

## 2017-12-01 ENCOUNTER — Ambulatory Visit (INDEPENDENT_AMBULATORY_CARE_PROVIDER_SITE_OTHER): Payer: BLUE CROSS/BLUE SHIELD | Admitting: Family Medicine

## 2017-12-01 VITALS — BP 133/86 | HR 73 | Temp 97.7°F | Ht 74.0 in | Wt 370.0 lb

## 2017-12-01 DIAGNOSIS — J069 Acute upper respiratory infection, unspecified: Secondary | ICD-10-CM

## 2017-12-01 MED ORDER — BENZONATATE 200 MG PO CAPS: 200.0000 mg | ORAL_CAPSULE | Freq: Two times a day (BID) | ORAL | 0 refills | Status: AC | PRN

## 2017-12-01 NOTE — Patient Instructions (Signed)
It appears that you have a viral upper respiratory infection (cold).  Cold symptoms can last up to 2 weeks.   I have prescribed you benzonatate to take twice a day if needed for cough.  This is safe to take at work and with driving if you need it.  You may continue Mucinex if needed for chest congestion.  As we discussed, I do recommend using Afrin nasal spray for the next 3 days so that you can breathe through your nose.  If you develop any other worrisome symptoms or signs that we discussed, please return for reevaluation.  - Get plenty of rest and drink plenty of fluids. - Try to breathe moist air. Use a cold mist humidifier. - Consume warm fluids (soup or tea) to provide relief for a stuffy nose and to loosen phlegm. - For nasal stuffiness, try saline nasal spray or a Neti Pot. Afrin nasal spray can also be used but this product should not be used longer than 3 days or it will cause rebound nasal stuffiness (worsening nasal congestion). - For sore throat pain relief: suck on throat lozenges, hard candy or popsicles; gargle with warm salt water (1/4 tsp. salt per 8 oz. of water); and eat soft, bland foods. - Eat a well-balanced diet. If you cannot, ensure you are getting enough nutrients by taking a daily multivitamin. - Avoid dairy products, as they can thicken phlegm. - Avoid alcohol, as it impairs your body's immune system.  CONTACT YOUR DOCTOR IF YOU EXPERIENCE ANY OF THE FOLLOWING: - High fever - Ear pain - Sinus-type headache - Unusually severe cold symptoms - Cough that gets worse while other cold symptoms improve - Flare up of any chronic lung problem, such as asthma - Your symptoms persist longer than 2 weeks   Upper Respiratory Infection, Adult Most upper respiratory infections (URIs) are caused by a virus. A URI affects the nose, throat, and upper air passages. The most common type of URI is often called "the common cold." Follow these instructions at home:  Take medicines only  as told by your doctor.  Gargle warm saltwater or take cough drops to comfort your throat as told by your doctor.  Use a warm mist humidifier or inhale steam from a shower to increase air moisture. This may make it easier to breathe.  Drink enough fluid to keep your pee (urine) clear or pale yellow.  Eat soups and other clear broths.  Have a healthy diet.  Rest as needed.  Go back to work when your fever is gone or your doctor says it is okay. ? You may need to stay home longer to avoid giving your URI to others. ? You can also wear a face mask and wash your hands often to prevent spread of the virus.  Use your inhaler more if you have asthma.  Do not use any tobacco products, including cigarettes, chewing tobacco, or electronic cigarettes. If you need help quitting, ask your doctor. Contact a doctor if:  You are getting worse, not better.  Your symptoms are not helped by medicine.  You have chills.  You are getting more short of breath.  You have brown or red mucus.  You have yellow or brown discharge from your nose.  You have pain in your face, especially when you bend forward.  You have a fever.  You have puffy (swollen) neck glands.  You have pain while swallowing.  You have white areas in the back of your throat. Get  help right away if:  You have very bad or constant: ? Headache. ? Ear pain. ? Pain in your forehead, behind your eyes, and over your cheekbones (sinus pain). ? Chest pain.  You have long-lasting (chronic) lung disease and any of the following: ? Wheezing. ? Long-lasting cough. ? Coughing up blood. ? A change in your usual mucus.  You have a stiff neck.  You have changes in your: ? Vision. ? Hearing. ? Thinking. ? Mood. This information is not intended to replace advice given to you by your health care provider. Make sure you discuss any questions you have with your health care provider. Document Released: 03/25/2008 Document Revised:  06/09/2016 Document Reviewed: 01/12/2014 Elsevier Interactive Patient Education  2018 [employer]

## 2017-12-01 NOTE — Telephone Encounter (Signed)
Please advise 

## 2017-12-01 NOTE — Telephone Encounter (Signed)
Ok to provide new note denoting no restrictions when he returns to work.  Please inform patient when this is ready.

## 2017-12-01 NOTE — Progress Notes (Signed)
Subjective: CC:? flu PCP: Adam Sheriff, MD [id number] Adam Gomez is a 27 y.o. male presenting to clinic today for:  1. Flu like symptoms  Patient reports sinus pressure, productive cough, headache, nasal congestion that started Saturday.  He reports fever to 101F.  He left work early Saturday because he was not feeling well.  He reports multiple persons in his household are sick at this time.  Denies hemoptysis, SOB, dizziness, rash, nausea, vomiting, chills, myalgia, recent travel.  Patient has used Mucinex, vitamin C, Tylenol and Motrin with some relief of symptoms.  Denies history of COPD or asthma.  Denies tobacco use/ exposure.    ROS: Per HPI  No Known Allergies No past medical history on file.  Current Outpatient Medications:  .  fluticasone (FLONASE) 50 MCG/ACT nasal spray, Place 1 spray into both nostrils 2 (two) times daily as needed for allergies or rhinitis., Disp: 16 g, Rfl: 6  Social History   Socioeconomic History  . Marital status: Married    Spouse name: Not on file  . Number of children: Not on file  . Years of education: Not on file  . Highest education level: Not on file  Social Needs  . Financial resource strain: Not on file  . Food insecurity - worry: Not on file  . Food insecurity - inability: Not on file  . Transportation needs - medical: Not on file  . Transportation needs - non-medical: Not on file  Occupational History  . Not on file  Tobacco Use  . Smoking status: Former Smoker    Last attempt to quit: 07/09/2012    Years since quitting: 5.4  . Smokeless tobacco: Former Engineer, water and Sexual Activity  . Alcohol use: No  . Drug use: No  . Sexual activity: Not on file  Other Topics Concern  . Not on file  Social History Narrative  . Not on file   No family history on file.  Objective: Office vital signs reviewed. BP 133/86   Pulse 73   Temp 97.7 F (36.5 C) (Oral)   Ht 6\' 2"  (1.88 m)   Wt (!) 370 lb (167.8 kg)   BMI 47.51  kg/m   Physical Examination:  General: Awake, alert, obese, No acute distress HEENT: Normal    Neck: No masses palpated. No lymphadenopathy    Ears: Tympanic membranes intact, normal light reflex, no erythema, no bulging    Eyes: PERRLA, extraocular membranes intact, sclera with mild injection on left medial aspect. No associated discharge    Nose: nasal turbinates moist, clear nasal discharge    Throat: moist mucus membranes, no erythema, no tonsillar exudate.  Airway is patent Cardio: regular rate and rhythm, S1S2 heard, no murmurs appreciated Pulm: clear to auscultation bilaterally, no wheezes, rhonchi or rales; normal work of breathing on room air  Assessment/ Plan: 27 y.o. male   1. URI with cough and congestion Patient is afebrile well-appearing.  No evidence of bacterial infection on exam.  Supportive care recommended.  Tessalon 200 mg Perles p.o. twice daily as needed cough.  Push oral fluids.  May continue Motrin and Tylenol if needed.  May use Afrin nasal spray for nasal congestion if needed.  Home care instructions reviewed and handout was provided.   Work note provided excusing for the next 48 hours.  Strict return precautions and reasons for emergent evaluation in the emergency department review with patient.  They voiced understanding and will follow-up as needed.  Meds ordered this encounter  Medications  . benzonatate (TESSALON) 200 MG capsule    Sig: Take 1 capsule (200 mg total) by mouth 2 (two) times daily as needed for cough.    Dispense:  20 capsule    Refill:  0     Adam Gomez, Adam Gomez [phone]

## 2017-12-01 NOTE — Telephone Encounter (Signed)
done

## 2018-07-07 ENCOUNTER — Ambulatory Visit: Payer: BLUE CROSS/BLUE SHIELD

## 2018-07-08 ENCOUNTER — Encounter: Payer: Self-pay | Admitting: Family Medicine

## 2018-07-08 ENCOUNTER — Ambulatory Visit (INDEPENDENT_AMBULATORY_CARE_PROVIDER_SITE_OTHER): Payer: BLUE CROSS/BLUE SHIELD | Admitting: Family Medicine

## 2018-07-08 VITALS — BP 141/96 | HR 77 | Temp 98.9°F | Ht 74.0 in | Wt 375.0 lb

## 2018-07-08 DIAGNOSIS — A084 Viral intestinal infection, unspecified: Secondary | ICD-10-CM | POA: Diagnosis not present

## 2018-07-08 DIAGNOSIS — I878 Other specified disorders of veins: Secondary | ICD-10-CM

## 2018-07-08 NOTE — Progress Notes (Signed)
Subjective:  Patient ID: Adam Gomez, male    DOB: 1990/12/25  Age: 27 y.o. MRN: [id number]  CC: Nausea (and 2 episodes of vomiting x 2 days) and Diarrhea (x 2 days. Has not taken anything OTC)   HPI Adam Gomez presents for onset 2 days ago started with a feeling of nauseousness while at work.  Subsequently followed by getting weaker and getting chills and sweats he had to go lay down in the break room at work and had to stay there the remainder of the shift some for hours.  He is having about 5 watery bowel movements per day but no abdominal pain.  His stool was loose this morning and mucoid.  He has had no chills since the initial onset.  He is beginning to feel better he is hydrating with juice and water.  He is still having some loose bowel movements.  Patient also expresses significant concern of his obesity.  He is wondering what the best way to go about weight loss would be.  He says he is added about 50 pounds in the last year and was morbidly obese prior to that time.  He is beginning to get brown discolorations in the lower leg near the ankle representative of venous stasis.  Depression screen [facility] 2/9 12/01/2017 03/18/2017 03/10/2017  Decreased Interest 0 0 0  Down, Depressed, Hopeless 0 0 -  PHQ - 2 Score 0 0 0    History Adam Gomez has no past medical history on file.   He has no past surgical history on file.   His family history is not on file.He reports that he quit smoking about 6 years ago. His smokeless tobacco use includes snuff. He reports that he does not drink alcohol or use drugs.    ROS Review of Systems  Constitutional: Positive for chills, diaphoresis and fever. Negative for unexpected weight change.  HENT: Negative for rhinorrhea and trouble swallowing.   Respiratory: Negative for cough, chest tightness and shortness of breath.   Cardiovascular: Negative for chest pain.  Gastrointestinal: Positive for abdominal distention, diarrhea and nausea. Negative  for abdominal pain, blood in stool, constipation, rectal pain and vomiting.  Genitourinary: Negative for dysuria, flank pain and hematuria.  Musculoskeletal: Negative for arthralgias and joint swelling.  Skin: Negative for rash.  Neurological: Negative for syncope and headaches.    Objective:  BP (!) 141/96 (BP Location: Right Arm, Patient Position: Sitting, Cuff Size: Normal)   Pulse 77   Temp 98.9 F (37.2 C) (Oral)   Ht 6' 2" (1.88 m)   Wt (!) 375 lb (170.1 kg)   BMI 48.15 kg/m   BP Readings from Last 3 Encounters:  07/08/18 (!) 141/96  12/01/17 133/86  10/29/17 130/85    Wt Readings from Last 3 Encounters:  07/08/18 (!) 375 lb (170.1 kg)  12/01/17 (!) 370 lb (167.8 kg)  10/29/17 (!) 372 lb (168.7 kg)     Physical Exam  Constitutional: He appears well-developed and well-nourished.  HENT:  Head: Normocephalic and atraumatic.  Right Ear: Tympanic membrane and external ear normal. No decreased hearing is noted.  Left Ear: Tympanic membrane and external ear normal. No decreased hearing is noted.  Mouth/Throat: No oropharyngeal exudate or posterior oropharyngeal erythema.  Eyes: Pupils are equal, round, and reactive to light.  Neck: Normal range of motion. Neck supple.  Cardiovascular: Normal rate and regular rhythm.  No murmur heard. Pulmonary/Chest: Breath sounds normal. No respiratory distress.  Abdominal: Soft. Bowel sounds are normal. He  exhibits no mass. There is no tenderness.  Skin: Skin is warm and dry.  There is brown cobblestoned mottling noted at the lower leg several centimeters above the ankles.  This is bilateral and it represents iron stains from venous stasis.  Vitals reviewed.     Assessment & Plan:   Adam Gomez was seen today for nausea and diarrhea.  Diagnoses and all orders for this visit:  Viral gastroenteritis  Morbid obesity ([place]) -     CBC with Differential/Platelet -     CMP14+EGFR -     Thyroid Panel With TSH -     Testosterone,Free  and Total -     Amb Ref to Medical Weight Management  Venous stasis       I am having Adam Gomez maintain his fluticasone and benzonatate.  Allergies as of 07/08/2018   No Known Allergies     Medication List        Accurate as of 07/08/18 12:49 PM. Always use your most recent med list.          benzonatate 200 MG capsule Commonly known as:  TESSALON Take 1 capsule (200 mg total) by mouth 2 (two) times daily as needed for cough.   fluticasone 50 MCG/ACT nasal spray Commonly known as:  FLONASE Place 1 spray into both nostrils 2 (two) times daily as needed for allergies or rhinitis.        Follow-up: Return if symptoms worsen or fail to improve.  Claretta Fraise, M.D.

## 2018-07-09 ENCOUNTER — Other Ambulatory Visit: Payer: BLUE CROSS/BLUE SHIELD

## 2018-07-09 DIAGNOSIS — Z0000 Encounter for general adult medical examination without abnormal findings: Secondary | ICD-10-CM | POA: Diagnosis not present

## 2018-07-12 LAB — CMP14+EGFR
ALT: 30 IU/L (ref 0–44)
AST: 17 IU/L (ref 0–40)
Albumin/Globulin Ratio: 1.4 (ref 1.2–2.2)
Albumin: 4.6 g/dL (ref 3.5–5.5)
Alkaline Phosphatase: 68 IU/L (ref 39–117)
BUN/Creatinine Ratio: 11 (ref 9–20)
BUN: 11 mg/dL (ref 6–20)
Bilirubin Total: 0.2 mg/dL (ref 0.0–1.2)
CO2: 25 mmol/L (ref 20–29)
Calcium: 9.6 mg/dL (ref 8.7–10.2)
Chloride: 102 mmol/L (ref 96–106)
Creatinine, Ser: 0.99 mg/dL (ref 0.76–1.27)
GFR calc Af Amer: 119 mL/min/{1.73_m2} (ref >59)
GFR calc non Af Amer: 103 mL/min/{1.73_m2} (ref >59)
Globulin, Total: 3.2 g/dL (ref 1.5–4.5)
Glucose: 88 mg/dL (ref 65–99)
Potassium: 4.8 mmol/L (ref 3.5–5.2)
Sodium: 141 mmol/L (ref 134–144)
Total Protein: 7.8 g/dL (ref 6.0–8.5)

## 2018-07-12 LAB — CBC WITH DIFFERENTIAL/PLATELET
BASOS ABS: 0.1 10*3/uL (ref 0.0–0.2)
Basos: 1 %
EOS (ABSOLUTE): 0.3 10*3/uL (ref 0.0–0.4)
Eos: 3 %
HEMOGLOBIN: 15.9 g/dL (ref 13.0–17.7)
Hematocrit: 47.1 % (ref 37.5–51.0)
Immature Grans (Abs): 0.1 10*3/uL (ref 0.0–0.1)
Immature Granulocytes: 1 %
LYMPHS ABS: 3.1 10*3/uL (ref 0.7–3.1)
LYMPHS: 26 %
MCH: 29.8 pg (ref 26.6–33.0)
MCHC: 33.8 g/dL (ref 31.5–35.7)
MCV: 88 fL (ref 79–97)
MONOCYTES: 9 %
Monocytes Absolute: 1.1 10*3/uL — ABNORMAL HIGH (ref 0.1–0.9)
NEUTROS ABS: 7.3 10*3/uL — AB (ref 1.4–7.0)
Neutrophils: 60 %
PLATELETS: 374 10*3/uL (ref 150–450)
RBC: 5.34 x10E6/uL (ref 4.14–5.80)
RDW: 12.6 % (ref 12.3–15.4)
WBC: 12 10*3/uL — AB (ref 3.4–10.8)

## 2018-07-12 LAB — TESTOSTERONE,FREE AND TOTAL
Testosterone, Free: 8.9 pg/mL — ABNORMAL LOW (ref 9.3–26.5)
Testosterone: 347 ng/dL (ref 264–916)

## 2018-07-12 LAB — THYROID PANEL WITH TSH
Free Thyroxine Index: 2.1 (ref 1.2–4.9)
T3 Uptake Ratio: 25 % (ref 24–39)
T4, Total: 8.3 ug/dL (ref 4.5–12.0)
TSH: 2.73 u[IU]/mL (ref 0.450–4.500)

## 2018-08-06 ENCOUNTER — Encounter: Payer: BLUE CROSS/BLUE SHIELD | Admitting: Pediatrics

## 2018-11-02 ENCOUNTER — Ambulatory Visit: Payer: BLUE CROSS/BLUE SHIELD | Admitting: Neurology

## 2018-12-23 ENCOUNTER — Ambulatory Visit: Payer: BLUE CROSS/BLUE SHIELD | Admitting: Physician Assistant

## 2018-12-23 ENCOUNTER — Ambulatory Visit: Payer: BLUE CROSS/BLUE SHIELD

## 2018-12-23 DIAGNOSIS — Z6841 Body Mass Index (BMI) 40.0 and over, adult: Secondary | ICD-10-CM | POA: Diagnosis not present

## 2018-12-23 DIAGNOSIS — G4730 Sleep apnea, unspecified: Secondary | ICD-10-CM | POA: Diagnosis not present

## 2018-12-23 DIAGNOSIS — J0120 Acute ethmoidal sinusitis, unspecified: Secondary | ICD-10-CM | POA: Diagnosis not present

## 2019-06-23 DIAGNOSIS — R197 Diarrhea, unspecified: Secondary | ICD-10-CM | POA: Diagnosis not present

## 2020-11-01 ENCOUNTER — Other Ambulatory Visit: Payer: Self-pay

## 2020-11-01 DIAGNOSIS — Z20822 Contact with and (suspected) exposure to covid-19: Secondary | ICD-10-CM

## 2020-11-03 ENCOUNTER — Telehealth: Payer: Self-pay

## 2020-11-03 ENCOUNTER — Telehealth: Payer: Self-pay | Admitting: Pediatrics

## 2020-11-03 LAB — SARS-COV-2, NAA 2 DAY TAT

## 2020-11-03 LAB — NOVEL CORONAVIRUS, NAA

## 2020-11-03 NOTE — Telephone Encounter (Signed)
Pt is calling to get his COVID results

## 2020-11-03 NOTE — Telephone Encounter (Signed)
Left detailed message test is not back yet

## 2020-11-03 NOTE — Telephone Encounter (Signed)
Je, please review and advise.

## 2020-11-03 NOTE — Telephone Encounter (Signed)
Amado Coe, RN  11/03/2020  5:20 PM EST Back to Top     Results viewed in MyChart on 11/03/20 at 3:48 pm.    You have viewed your result in MyChart. The lab was unable to determine a result from the specimen provided. You will need to retest. Please call [facility] at [phone] with any questions or concerns.     This was what I found, looks like patient is already aware.

## 2020-11-04 NOTE — Telephone Encounter (Signed)
Patient aware of results.  He reports he is back at work and is feeling fine and does not want to retest.

## 2021-12-31 ENCOUNTER — Encounter: Payer: Self-pay | Admitting: Emergency Medicine

## 2021-12-31 ENCOUNTER — Emergency Department
Admission: EM | Admit: 2021-12-31 | Discharge: 2021-12-31 | Disposition: A | Discharge status: Home / Self Care | Payer: BC Managed Care – PPO | Attending: Emergency Medicine | Admitting: Emergency Medicine

## 2021-12-31 ENCOUNTER — Other Ambulatory Visit: Payer: Self-pay

## 2021-12-31 ENCOUNTER — Emergency Department: Payer: BC Managed Care – PPO

## 2021-12-31 DIAGNOSIS — R1084 Generalized abdominal pain: Secondary | ICD-10-CM | POA: Insufficient documentation

## 2021-12-31 DIAGNOSIS — R109 Unspecified abdominal pain: Secondary | ICD-10-CM | POA: Diagnosis present

## 2021-12-31 LAB — COMPREHENSIVE METABOLIC PANEL
ALT: 20 U/L (ref 0–44)
AST: 19 U/L (ref 15–41)
Albumin: 4.1 g/dL (ref 3.5–5.0)
Alkaline Phosphatase: 57 U/L (ref 38–126)
Anion gap: 5 (ref 5–15)
BUN: 11 mg/dL (ref 6–20)
CO2: 29 mmol/L (ref 22–32)
Calcium: 8.7 mg/dL — ABNORMAL LOW (ref 8.9–10.3)
Chloride: 100 mmol/L (ref 98–111)
Creatinine, Ser: 0.87 mg/dL (ref 0.61–1.24)
GFR, Estimated: >60 mL/min (ref >60)
Glucose, Bld: 92 mg/dL (ref 70–99)
Potassium: 3.9 mmol/L (ref 3.5–5.1)
Sodium: 134 mmol/L — ABNORMAL LOW (ref 135–145)
Total Bilirubin: 0.5 mg/dL (ref 0.3–1.2)
Total Protein: 8.6 g/dL — ABNORMAL HIGH (ref 6.5–8.1)

## 2021-12-31 LAB — LIPASE, BLOOD: Lipase: 28 U/L (ref 11–51)

## 2021-12-31 LAB — CBC
HCT: 48.6 % (ref 39.0–52.0)
Hemoglobin: 15.5 g/dL (ref 13.0–17.0)
MCH: 28.6 pg (ref 26.0–34.0)
MCHC: 31.9 g/dL (ref 30.0–36.0)
MCV: 89.7 fL (ref 80.0–100.0)
Platelets: 343 10*3/uL (ref 150–400)
RBC: 5.42 MIL/uL (ref 4.22–5.81)
RDW: 12.7 % (ref 11.5–15.5)
WBC: 11.3 10*3/uL — ABNORMAL HIGH (ref 4.0–10.5)
nRBC: 0 % (ref 0.0–0.2)

## 2021-12-31 LAB — URINALYSIS, ROUTINE W REFLEX MICROSCOPIC
Bilirubin Urine: NEGATIVE
Glucose, UA: NEGATIVE mg/dL
Hgb urine dipstick: NEGATIVE
Ketones, ur: NEGATIVE mg/dL
Leukocytes,Ua: NEGATIVE
Nitrite: NEGATIVE
Protein, ur: NEGATIVE mg/dL
Specific Gravity, Urine: >1.046 — ABNORMAL HIGH (ref 1.005–1.030)
pH: 5 (ref 5.0–8.0)

## 2021-12-31 MED ORDER — DICYCLOMINE HCL 10 MG PO CAPS: 10.0000 mg | ORAL_CAPSULE | Freq: Three times a day (TID) | ORAL | 0 refills | Status: AC

## 2021-12-31 MED ORDER — POLYETHYLENE GLYCOL 3350 17 G PO PACK: 17.0000 g | PACK | Freq: Every day | ORAL | 0 refills | Status: AC

## 2021-12-31 MED ORDER — IOHEXOL 350 MG/ML SOLN
100.0000 mL | Freq: Once | INTRAVENOUS | Status: AC | PRN
  Administered 2021-12-31: 100 mL via INTRAVENOUS
  Filled 2021-12-31: qty 100

## 2021-12-31 NOTE — ED Triage Notes (Signed)
Pt to ED via POV with c/o being at work and felt "queasy"  so [facility] felt his RLQ and they wanted him to come her to have a CT scan for appendicitis. Pt reports that he has not had a BM for a few days.  ?

## 2021-12-31 NOTE — ED Notes (Signed)
See triage note  presents some abd pain  states he felt queasy today  went to [facility]   they sent him here for possible CT scan  states he has had some episodes of feeling bloated  lately  no fever or n/v/d ?

## 2021-12-31 NOTE — ED Provider Notes (Signed)
? ?[facility] ?Provider Note ? ?Patient Contact: 6:16 PM (approximate) ? ? ?History  ? ?Abdominal Pain ? ? ?HPI ? ?Adam Gomez is a 31 y.o. male who presents the emergency department from urgent care for evaluation of abdominal pain.  Patient has noticed some bowel changes with going to the bathroom last.  Patient typically goes 2-3 times, is only going once a day recently.  Abdominal pain is diffuse in nature without 1 reported quadrant being worse than the others.  It is more a "full" feeling versus a true pain.  Patient has had no emesis, diarrhea.  No urinary changes.  Patient has been afebrile.  I have asked many tested negative for COVID and flu.  He states that they performed an x-ray but reportedly the quality of the x-ray was too poor to tell anything.  Patient was sent to the emergency department for evaluation for possible appendicitis. ?  ? ? ?Physical Exam  ? ?Triage Vital Signs: ?ED Triage Vitals  ?Enc Vitals Group  ?   BP 12/31/21 1611 (!) 121/55  ?   Pulse Rate 12/31/21 1611 79  ?   Resp 12/31/21 1611 20  ?   Temp 12/31/21 1611 98.7 ?F (37.1 ?C)  ?   Temp Source 12/31/21 1611 Oral  ?   SpO2 12/31/21 1611 98 %  ?   Weight 12/31/21 1612 (!) 416 lb (188.7 kg)  ?   Height 12/31/21 1612 6\' 2"  (1.88 m)  ?   Head Circumference --   ?   Peak Flow --   ?   Pain Score 12/31/21 1611 0  ?   Pain Loc --   ?   Pain Edu? --   ?   Excl. in [place]? --   ? ? ?Most recent vital signs: ?Vitals:  ? 12/31/21 1611 12/31/21 1825  ?BP: (!) 121/55 (!) 120/50  ?Pulse: 79 70  ?Resp: 20 20  ?Temp: 98.7 ?F (37.1 ?C)   ?SpO2: 98% 98%  ? ? ? ?General: Alert and in no acute distress.  ?Cardiovascular:  Good peripheral perfusion ?Respiratory: Normal respiratory effort without tachypnea or retractions. Lungs CTAB. Good air entry to the bases with no decreased or absent breath sounds. ?Gastrointestinal: Bowel sounds ?4 quadrants.  Soft to palpation all quadrants.  Mildly tender to palpation in the left lower  quadrant.  No right upper or right lower quadrant tenderness on exam.  No guarding or rigidity. No palpable masses. No distention. No CVA tenderness. ?Musculoskeletal: Full range of motion to all extremities.  ?Neurologic:  No gross focal neurologic deficits are appreciated.  ?Skin:   No rash noted ?Other: ? ? ?ED Results / Procedures / Treatments  ? ?Labs ?(all labs ordered are listed, but only abnormal results are displayed) ?Labs Reviewed  ?COMPREHENSIVE METABOLIC PANEL - Abnormal; Notable for the following components:  ?    Result Value  ? Sodium 134 (*)   ? Calcium 8.7 (*)   ? Total Protein 8.6 (*)   ? All other components within normal limits  ?CBC - Abnormal; Notable for the following components:  ? WBC 11.3 (*)   ? All other components within normal limits  ?LIPASE, BLOOD  ?URINALYSIS, ROUTINE W REFLEX MICROSCOPIC  ? ? ? ?EKG ? ? ? ? ?RADIOLOGY ? ?I personally viewed and evaluated these images as part of my medical decision making, as well as reviewing the written report by the radiologist. ? ?ED Provider Interpretation: No acute findings on CT  abdomen or pelvis.  Specifically no evidence of appendicitis, cholecystitis, bowel obstruction, colitis.  Patient has slight stool burden in the right ascending colon however no significant signs of constipation. ? ?CT ABDOMEN PELVIS W CONTRAST ? ?Result Date: 12/31/2021 ?CLINICAL DATA:  Abdominal pain EXAM: CT ABDOMEN AND PELVIS WITH CONTRAST TECHNIQUE: Multidetector CT imaging of the abdomen and pelvis was performed using the standard protocol following bolus administration of intravenous contrast. RADIATION DOSE REDUCTION: This exam was performed according to the departmental dose-optimization program which includes automated exposure control, adjustment of the mA and/or kV according to patient size and/or use of iterative reconstruction technique. CONTRAST:  148mL OMNIPAQUE IOHEXOL 350 MG/ML SOLN COMPARISON:  None. FINDINGS: Lower chest: No acute abnormality.  Hepatobiliary: No focal liver abnormality is seen. No gallstones, gallbladder wall thickening, or biliary dilatation. Pancreas: Fatty infiltration of the pancreatic head. No pancreatic ductal dilatation or surrounding inflammatory changes. Spleen: Normal in size without focal abnormality. Adrenals/Urinary Tract: Bilateral adrenal glands are unremarkable. Kidneys enhance symmetrically with no evidence of hydronephrosis or nephrolithiasis. Bladder is unremarkable. Stomach/Bowel: Stomach is within normal limits. Appendix appears normal. No evidence of bowel wall thickening, distention, or inflammatory changes. Vascular/Lymphatic: No significant vascular findings are present. No enlarged abdominal or pelvic lymph nodes. Reproductive: Prostate is unremarkable. Other: Small fat containing umbilical hernia. Musculoskeletal: No acute or significant osseous findings. IMPRESSION: 1. No acute findings in the abdomen or pelvis. 2. Normal appendix. Electronically Signed   By: Yetta Glassman M.D.   On: 12/31/2021 19:09   ? ?PROCEDURES: ? ?Critical Care performed: No ? ?Procedures ? ? ?MEDICATIONS ORDERED IN ED: ?Medications  ?iohexol (OMNIPAQUE) 350 MG/ML injection 100 mL (100 mLs Intravenous Contrast Given 12/31/21 1849)  ? ? ? ?IMPRESSION / MDM / ASSESSMENT AND PLAN / ED COURSE  ?I reviewed the triage vital signs and the nursing notes. ?             ?               ? ?Differential diagnosis includes, but is not limited to, constipation, appendicitis, cholecystitis, colitis, viral gastroenteritis ? ? ?Patient's diagnosis is consistent with abdominal pain.  Patient presented to the emergency department with squeezing and slight diffuse abdominal pain.  [facility] from [facility] for [facility]icit[facility].  Overall exam was reassuring.  No specific point tenderness to palpation.  Patient had labs, imaging performed here in the emergency department.  No acute findings on imaging.  I recommend increasing fiber, plenty of  fluids to keep the patient "regular."  Also I recommend using probiotics.  I will prescribe some Bentyl for his cramping sensation.  Concerning signs and symptoms are discussed with the patient.  No indication for further work-up currently.  Follow-up primary care as needed..  Patient is given ED precautions to return to the ED for any worsening or new symptoms. ? ? ? ?  ? ? ?FINAL CLINICAL IMPRESSION(S) / ED DIAGNOSES  ? ?Final diagnoses:  ?Generalized abdominal pain  ? ? ? ?Rx / DC Orders  ? ?ED Discharge Orders   ? ?      Ordered  ?  dicyclomine (BENTYL) 10 MG capsule  3 times daily before meals & bedtime       ? 12/31/21 1956  ?  polyethylene glycol (MIRALAX) 17 g packet  Daily       ? 12/31/21 1956  ? ?  ?  ? ?  ? ? ? ?Note:  This document was prepared  using Systems analyst and may include unintentional dictation errors. ?  ?Darletta Moll, PA-C ?12/31/21 1956 ? ?  ?Vanessa College, MD ?12/31/21 2328 ? ?

## 2024-06-30 ENCOUNTER — Encounter (HOSPITAL_COMMUNITY): Payer: Self-pay

## 2024-06-30 ENCOUNTER — Other Ambulatory Visit: Payer: Self-pay

## 2024-06-30 ENCOUNTER — Emergency Department (HOSPITAL_COMMUNITY): Admission: EM | Admit: 2024-06-30 | Discharge: 2024-06-30 | Disposition: A | Discharge status: Home / Self Care

## 2024-06-30 DIAGNOSIS — N3001 Acute cystitis with hematuria: Secondary | ICD-10-CM | POA: Insufficient documentation

## 2024-06-30 DIAGNOSIS — E119 Type 2 diabetes mellitus without complications: Secondary | ICD-10-CM | POA: Insufficient documentation

## 2024-06-30 DIAGNOSIS — R350 Frequency of micturition: Secondary | ICD-10-CM | POA: Diagnosis present

## 2024-06-30 HISTORY — DX: Pure hypercholesterolemia, unspecified: E78.00

## 2024-06-30 HISTORY — DX: Type 2 diabetes mellitus without complications: E11.9

## 2024-06-30 LAB — CBC
HCT: 45.8 % (ref 39.0–52.0)
Hemoglobin: 14.5 g/dL (ref 13.0–17.0)
MCH: 28.5 pg (ref 26.0–34.0)
MCHC: 31.7 g/dL (ref 30.0–36.0)
MCV: 90.2 fL (ref 80.0–100.0)
Platelets: 289 K/uL (ref 150–400)
RBC: 5.08 MIL/uL (ref 4.22–5.81)
RDW: 13.3 % (ref 11.5–15.5)
WBC: 13.1 K/uL — ABNORMAL HIGH (ref 4.0–10.5)
nRBC: 0 % (ref 0.0–0.2)

## 2024-06-30 LAB — URINALYSIS, ROUTINE W REFLEX MICROSCOPIC
Bilirubin Urine: NEGATIVE
Glucose, UA: NEGATIVE mg/dL
Ketones, ur: NEGATIVE mg/dL
Nitrite: NEGATIVE
Protein, ur: 30 mg/dL — AB
Specific Gravity, Urine: 1.029 (ref 1.005–1.030)
WBC, UA: >50 WBC/hpf (ref 0–5)
pH: 5 (ref 5.0–8.0)

## 2024-06-30 LAB — BASIC METABOLIC PANEL WITH GFR
Anion gap: 13 (ref 5–15)
BUN: 10 mg/dL (ref 6–20)
CO2: 23 mmol/L (ref 22–32)
Calcium: 9.4 mg/dL (ref 8.9–10.3)
Chloride: 100 mmol/L (ref 98–111)
Creatinine, Ser: 0.83 mg/dL (ref 0.61–1.24)
GFR, Estimated: >60 mL/min (ref >60)
Glucose, Bld: 126 mg/dL — ABNORMAL HIGH (ref 70–99)
Potassium: 4.2 mmol/L (ref 3.5–5.1)
Sodium: 135 mmol/L (ref 135–145)

## 2024-06-30 LAB — CBG MONITORING, ED: Glucose-Capillary: 129 mg/dL — ABNORMAL HIGH (ref 70–99)

## 2024-06-30 MED ORDER — CIPROFLOXACIN HCL 500 MG PO TABS: 500.0000 mg | ORAL_TABLET | Freq: Two times a day (BID) | ORAL | 0 refills | Status: AC

## 2024-06-30 NOTE — ED Triage Notes (Signed)
 Patient has had increased urination since Sunday night. He said last night he woke up 6 times to urinate. Has had fever, chills, headache. He thinks his diabetes is causing this difficulty or prostate problems. Denies burning urination. Has pressure in his prostate area when he sits down.

## 2024-06-30 NOTE — Discharge Instructions (Addendum)
 Please follow up with urology and your primary doctor. We are prescribing antibiotics. Please take them until completed even if symptoms improve. Return if fevers, chills, worsening abdominal pain, blood in urine, stop urinating, chest pain, shortness of breath or any new or worsening symptoms that are concerning to you.

## 2024-06-30 NOTE — ED Provider Notes (Signed)
 [place] EMERGENCY DEPARTMENT AT [facility] Provider Note   CSN: [id number] Arrival date & time: 06/30/24  9173     Patient presents with: Urinary Frequency   Adam Gomez is a 33 y.o. male.   This is a 33 year old male presenting emergency department for urinary frequency.  He has noted since Sunday he is urinating quite frequently, has had a couple episodes of incontinence while he was trying to make it to the bathroom.  No dysuria.  No hematuria.  No penile discharge or lesions.  No new sexual partners and low suspicion for STI.  Moving bowels normally.  No nausea no vomiting.  No flank pain.  No back pain.  No weakness in lower extremities.   Urinary Frequency       Prior to Admission medications   Medication Sig Start Date End Date Taking? Authorizing Provider  ciprofloxacin  (CIPRO ) 500 MG tablet Take 1 tablet (500 mg total) by mouth every 12 (twelve) hours. 06/30/24  Yes Neysa Caron PARAS, DO  benzonatate  (TESSALON ) 200 MG capsule Take 1 capsule (200 mg total) by mouth 2 (two) times daily as needed for cough. 12/01/17   Jolinda Norene HERO, DO  dicyclomine  (BENTYL ) 10 MG capsule Take 1 capsule (10 mg total) by mouth 4 (four) times daily -  before meals and at bedtime. 12/31/21   Cuthriell, Jonathan D, PA-C  fluticasone  (FLONASE ) 50 MCG/ACT nasal spray Place 1 spray into both nostrils 2 (two) times daily as needed for allergies or rhinitis. 03/18/17   Dettinger, Fonda LABOR, MD  polyethylene glycol (MIRALAX ) 17 g packet Take 17 g by mouth daily. 12/31/21   Cuthriell, Dorn BIRCH, PA-C    Allergies: Penicillins    Review of Systems  Genitourinary:  Positive for frequency.    Updated Vital Signs BP (!) 148/81   Pulse 100   Temp 98.2 F (36.8 C) (Oral)   Resp 19   Ht 6' 2 (1.88 m)   Wt (!) 186 kg   SpO2 100%   BMI 52.64 kg/m   Physical Exam Vitals and nursing note reviewed.  Constitutional:      General: He is not in acute distress.    Appearance: He is  obese. He is not toxic-appearing.  HENT:     Head: Normocephalic.     Nose: Nose normal.     Mouth/Throat:     Mouth: Mucous membranes are moist.  Eyes:     Conjunctiva/sclera: Conjunctivae normal.  Cardiovascular:     Rate and Rhythm: Normal rate and regular rhythm.  Pulmonary:     Effort: Pulmonary effort is normal.     Breath sounds: Normal breath sounds.  Abdominal:     General: Abdomen is flat. There is no distension.     Tenderness: There is no abdominal tenderness. There is no guarding or rebound.  Musculoskeletal:        General: Normal range of motion.  Skin:    General: Skin is warm and dry.     Capillary Refill: Capillary refill takes less than 2 seconds.  Neurological:     Mental Status: He is alert and oriented to person, place, and time.  Psychiatric:        Mood and Affect: Mood normal.        Behavior: Behavior normal.    (all labs ordered are listed, but only abnormal results are displayed) Labs Reviewed  URINALYSIS, ROUTINE W REFLEX MICROSCOPIC - Abnormal; Notable for the following components:  Result Value   Color, Urine AMBER (*)    APPearance HAZY (*)    Hgb urine dipstick MODERATE (*)    Protein, ur 30 (*)    Leukocytes,Ua MODERATE (*)    Bacteria, UA FEW (*)    All other components within normal limits  BASIC METABOLIC PANEL WITH GFR - Abnormal; Notable for the following components:   Glucose, Bld 126 (*)    All other components within normal limits  CBC - Abnormal; Notable for the following components:   WBC 13.1 (*)    All other components within normal limits  CBG MONITORING, ED - Abnormal; Notable for the following components:   Glucose-Capillary 129 (*)    All other components within normal limits   EKG: None  Radiology: No results found.   Procedures   Medications Ordered in the ED - No data to display  Clinical Course as of 06/30/24 1756  Wed Jun 30, 2024  9147 CT abd in 2023 per my chart review: IMPRESSION: 1. No acute  findings in the abdomen or pelvis. 2. Normal appendix.  [TY]  [id number] Glucose-Capillary(!): 129 [TY]  1755 Basic metabolic panel(!) No kidney injury [TY]  1755 CBC(!) Minor white count which point with infection [TY]  1755 Urinalysis, Routine w reflex microscopic -Urine, Clean Catch(!) It appears to have some evidence of UTI, given his clinical picture we will treat with antibiotics. [TY]    Clinical Course User Index [TY] Neysa Caron PARAS, DO                                 Medical Decision Making This is a 33 year old male with complicated past medical history to include obesity diabetes and hyperlipidemia he presented with urinary frequency for the past several days.  Clinically well-appearing.  Benign abdominal exam.  Reassuring vitals.  Initial blood sugar 129, lower suspicion for DKA.  He did ha symptoms ve a normal CT abdominal scan in 2023 per my chart review with no masses and a normal prostate at that time.  He is not having back pain or focal neurodeficits to make me think he is having cauda equina causing his urinary incontinence.  Benign abdominal exam, low suspicion for acute surgical pathology.  Concern for UTI versus neurogenic bladder secondary to his diabetes versus prostatitis.  Will get screening labs and UA.  Will get postvoid residual as well.   See ED course for further MDM/disposition.   Amount and/or Complexity of Data Reviewed Labs: ordered. Decision-making details documented in ED Course.  Risk Prescription drug management.      Final diagnoses:  Acute cystitis with hematuria    ED Discharge Orders          Ordered    ciprofloxacin  (CIPRO ) 500 MG tablet  Every 12 hours        06/30/24 1010               Neysa Caron PARAS, DO 06/30/24 1756
